# Patient Record
Sex: Female | Born: 1970 | Race: White | Hispanic: Yes | Marital: Married | Smoking: Former smoker
Health system: Southern US, Community
[De-identification: ages and names within clinical notes are randomized; demographics above are authoritative.]

## PROBLEM LIST (undated history)

## (undated) DIAGNOSIS — Z8619 Personal history of other infectious and parasitic diseases: Secondary | ICD-10-CM

## (undated) DIAGNOSIS — J45909 Unspecified asthma, uncomplicated: Secondary | ICD-10-CM

## (undated) DIAGNOSIS — T7840XA Allergy, unspecified, initial encounter: Secondary | ICD-10-CM

## (undated) DIAGNOSIS — K645 Perianal venous thrombosis: Secondary | ICD-10-CM

## (undated) HISTORY — PX: CYST EXCISION: SHX5701

## (undated) HISTORY — DX: Allergy, unspecified, initial encounter: T78.40XA

## (undated) HISTORY — DX: Perianal venous thrombosis: K64.5

## (undated) HISTORY — PX: TONSILLECTOMY: SUR1361

## (undated) HISTORY — DX: Personal history of other infectious and parasitic diseases: Z86.19

## (undated) HISTORY — DX: Unspecified asthma, uncomplicated: J45.909

---

## 2010-03-21 ENCOUNTER — Ambulatory Visit (HOSPITAL_COMMUNITY)
Admission: RE | Admit: 2010-03-21 | Discharge: 2010-03-21 | Payer: Self-pay | Source: Home / Self Care | Admitting: Surgery

## 2010-07-12 LAB — CBC
HCT: 39.6 % (ref 36.0–46.0)
Hemoglobin: 13.6 g/dL (ref 12.0–15.0)
MCHC: 34.3 g/dL (ref 30.0–36.0)

## 2010-07-12 LAB — SURGICAL PCR SCREEN: MRSA, PCR: NEGATIVE

## 2013-11-05 ENCOUNTER — Encounter (INDEPENDENT_AMBULATORY_CARE_PROVIDER_SITE_OTHER): Payer: BC Managed Care – PPO | Admitting: General Surgery

## 2013-11-06 ENCOUNTER — Encounter (INDEPENDENT_AMBULATORY_CARE_PROVIDER_SITE_OTHER): Payer: Self-pay | Admitting: General Surgery

## 2013-11-06 ENCOUNTER — Ambulatory Visit (INDEPENDENT_AMBULATORY_CARE_PROVIDER_SITE_OTHER): Payer: BC Managed Care – PPO | Admitting: General Surgery

## 2013-11-06 VITALS — BP 112/69 | HR 62 | Temp 98.1°F | Resp 14 | Ht 62.0 in | Wt 117.6 lb

## 2013-11-06 DIAGNOSIS — K645 Perianal venous thrombosis: Secondary | ICD-10-CM | POA: Insufficient documentation

## 2013-11-06 NOTE — Progress Notes (Signed)
Patient ID: Rachel Castillo, female   DOB: 1971-02-23, 43 y.o.   MRN: [id number]  Chief Complaint  Patient presents with  . Rectal Pain  . Rectal Problems    HPI Rachel Castillo is a 43 y.o. female.  Chief complaint: External hemorrhoids HPI Patient has a history of hemorrhoids in the past since she was pregnant 16 years ago. She developed worsening problems couple days ago. She was seen at urgent care and Dr. Dannielle HuhBlevins asked us to see her in consultation for thrombosed external hemorrhoids. She has been using Anusol and Epsom salt baths with partial relief. Past Medical History  Diagnosis Date  . External thrombosed hemorrhoids   . Asthma     Past Surgical History  Procedure Laterality Date  . Cesarean section    . Cyst excision      History reviewed. No pertinent family history.  Social History History  Substance Use Topics  . Smoking status: Former Games developermoker  . Smokeless tobacco: Not on file  . Alcohol Use: No    No Known Allergies  Current Outpatient Prescriptions  Medication Sig Dispense Refill  . albuterol (PROVENTIL HFA;VENTOLIN HFA) 108 (90 BASE) MCG/ACT inhaler Inhale into the lungs every 6 (six) hours as needed for wheezing or shortness of breath.      Marland Kitchen. azithromycin (ZITHROMAX) 250 MG tablet Take by mouth daily.      . hydrocortisone (ANUSOL-HC) 25 MG suppository Place 25 mg rectally 2 (two) times daily.      . predniSONE (DELTASONE) 50 MG tablet Take 50 mg by mouth daily with breakfast.       No current facility-administered medications for this visit.    Review of Systems Review of Systems  Constitutional: Negative for fever, chills and unexpected weight change.  HENT: Negative for congestion, hearing loss, sore throat, trouble swallowing and voice change.   Eyes: Negative for visual disturbance.  Respiratory: Negative for cough and wheezing.   Cardiovascular: Negative for chest pain, palpitations and leg swelling.  Gastrointestinal: Positive for rectal pain.  Negative for nausea, vomiting, abdominal pain, diarrhea, constipation, blood in stool, abdominal distention and anal bleeding.  Genitourinary: Negative for hematuria, vaginal bleeding and difficulty urinating.  Musculoskeletal: Negative for arthralgias.  Skin: Negative for rash and wound.  Neurological: Negative for seizures, syncope and headaches.  Hematological: Negative for adenopathy. Does not bruise/bleed easily.  Psychiatric/Behavioral: Negative for confusion.    Blood pressure 112/69, pulse 62, temperature 98.1 F (36.7 C), temperature source Temporal, resp. rate 14, height 5\' 2"  (1.575 m), weight 117 lb 9.6 oz (53.343 kg).  Physical Exam Physical Exam  Constitutional: She appears well-developed and well-nourished.  HENT:  Head: Normocephalic and atraumatic.  Cardiovascular: Normal rate and normal heart sounds.   Pulmonary/Chest: Effort normal and breath sounds normal.  Abdominal: Soft. She exhibits no distension. There is no tenderness.  Genitourinary:  External anal exam reveals 2 thrombosed external hemorrhoids. Digital rectal exam was deferred due to pain. Area was prepped in sterile fashion. Local anesthetic was injected. Incision was made over each and large clots were removed. She tolerated this fairly well. Bleeding was controlled with pressure and then a gauze was placed.   Assessment    Thrombosed external Hemorrhoids x2     Plan    These were excised as above. Wound care instructions were given. She will return as needed.       Ilona Colley E 11/06/2013, 2:36 PM

## 2016-03-16 DIAGNOSIS — Z23 Encounter for immunization: Secondary | ICD-10-CM | POA: Diagnosis not present

## 2016-03-16 DIAGNOSIS — J0110 Acute frontal sinusitis, unspecified: Secondary | ICD-10-CM | POA: Diagnosis not present

## 2016-03-29 DIAGNOSIS — R05 Cough: Secondary | ICD-10-CM | POA: Diagnosis not present

## 2016-03-29 DIAGNOSIS — J3089 Other allergic rhinitis: Secondary | ICD-10-CM | POA: Diagnosis not present

## 2016-09-14 DIAGNOSIS — J209 Acute bronchitis, unspecified: Secondary | ICD-10-CM | POA: Diagnosis not present

## 2017-01-22 DIAGNOSIS — B001 Herpesviral vesicular dermatitis: Secondary | ICD-10-CM | POA: Diagnosis not present

## 2017-03-29 ENCOUNTER — Ambulatory Visit: Payer: Self-pay | Admitting: Family Medicine

## 2017-04-03 ENCOUNTER — Other Ambulatory Visit: Payer: Self-pay | Admitting: *Deleted

## 2017-04-03 ENCOUNTER — Other Ambulatory Visit (HOSPITAL_COMMUNITY)
Admission: RE | Admit: 2017-04-03 | Discharge: 2017-04-03 | Disposition: A | Discharge status: Home / Self Care | Payer: BLUE CROSS/BLUE SHIELD | Source: Ambulatory Visit | Attending: Family Medicine | Admitting: Family Medicine

## 2017-04-03 ENCOUNTER — Ambulatory Visit: Payer: Self-pay | Admitting: Family Medicine

## 2017-04-03 ENCOUNTER — Encounter: Payer: Self-pay | Admitting: Family Medicine

## 2017-04-03 VITALS — BP 110/60 | HR 91 | Temp 98.1°F | Ht 63.0 in | Wt 120.0 lb

## 2017-04-03 DIAGNOSIS — F40298 Other specified phobia: Secondary | ICD-10-CM | POA: Diagnosis not present

## 2017-04-03 DIAGNOSIS — Z124 Encounter for screening for malignant neoplasm of cervix: Secondary | ICD-10-CM | POA: Insufficient documentation

## 2017-04-03 DIAGNOSIS — Z23 Encounter for immunization: Secondary | ICD-10-CM | POA: Diagnosis not present

## 2017-04-03 DIAGNOSIS — J4520 Mild intermittent asthma, uncomplicated: Secondary | ICD-10-CM | POA: Diagnosis not present

## 2017-04-03 DIAGNOSIS — Z0000 Encounter for general adult medical examination without abnormal findings: Secondary | ICD-10-CM

## 2017-04-03 DIAGNOSIS — J302 Other seasonal allergic rhinitis: Secondary | ICD-10-CM | POA: Insufficient documentation

## 2017-04-03 LAB — COMPREHENSIVE METABOLIC PANEL
ALBUMIN: 4.7 g/dL (ref 3.5–5.2)
ALT: 8 U/L (ref 0–35)
AST: 13 U/L (ref 0–37)
Alkaline Phosphatase: 38 U/L — ABNORMAL LOW (ref 39–117)
BUN: 15 mg/dL (ref 6–23)
CHLORIDE: 103 meq/L (ref 96–112)
CO2: 28 meq/L (ref 19–32)
CREATININE: 0.73 mg/dL (ref 0.40–1.20)
Calcium: 9.5 mg/dL (ref 8.4–10.5)
GFR: 90.74 mL/min (ref >60.00)
Glucose, Bld: 85 mg/dL (ref 70–99)
POTASSIUM: 3.8 meq/L (ref 3.5–5.1)
SODIUM: 138 meq/L (ref 135–145)
Total Bilirubin: 0.6 mg/dL (ref 0.2–1.2)
Total Protein: 6.8 g/dL (ref 6.0–8.3)

## 2017-04-03 LAB — CBC WITH DIFFERENTIAL/PLATELET
BASOS PCT: 1.2 % (ref 0.0–3.0)
Basophils Absolute: 0.1 10*3/uL (ref 0.0–0.1)
EOS ABS: 0.1 10*3/uL (ref 0.0–0.7)
EOS PCT: 1.3 % (ref 0.0–5.0)
HCT: 40.3 % (ref 36.0–46.0)
Hemoglobin: 13.5 g/dL (ref 12.0–15.0)
LYMPHS ABS: 1.7 10*3/uL (ref 0.7–4.0)
Lymphocytes Relative: 30.7 % (ref 12.0–46.0)
MCHC: 33.4 g/dL (ref 30.0–36.0)
MCV: 90.4 fl (ref 78.0–100.0)
MONO ABS: 0.4 10*3/uL (ref 0.1–1.0)
Monocytes Relative: 7.6 % (ref 3.0–12.0)
NEUTROS ABS: 3.3 10*3/uL (ref 1.4–7.7)
NEUTROS PCT: 59.2 % (ref 43.0–77.0)
PLATELETS: 270 10*3/uL (ref 150.0–400.0)
RBC: 4.45 Mil/uL (ref 3.87–5.11)
RDW: 13.3 % (ref 11.5–15.5)
WBC: 5.6 10*3/uL (ref 4.0–10.5)

## 2017-04-03 LAB — LIPID PANEL
CHOL/HDL RATIO: 4
CHOLESTEROL: 204 mg/dL — AB (ref 0–200)
HDL: 50.4 mg/dL (ref >39.00)
LDL CALC: 136 mg/dL — AB (ref 0–99)
NonHDL: 153.26
Triglycerides: 87 mg/dL (ref 0.0–149.0)
VLDL: 17.4 mg/dL (ref 0.0–40.0)

## 2017-04-03 NOTE — Progress Notes (Signed)
Subjective  Chief Complaint  Patient presents with  . Establish Care  . Annual Exam    with pap    HPI: Rachel Castillo is a 46 y.o. female who presents to [facility] [place] today for a Female Wellness Visit.  She is a new patient.   Wellness Visit: annual visit with health maintenance review and exam with Pap   This is a very pleasant 46 year old G1P1001 who is healthy and needs a complete physical.  She is originally from [place].  She is married, works at [employer], and has lived in [place] since 2005.  She has 3 grown stepchildren.  She is happy, active and lives a healthy lifestyle.  Past medical history is significant for allergic extrinsic asthma.  She rarely needs albuterol and only needs it when her allergies are moderate to severe.  She does use Flonase or Allegra as needed.  This is rare however.  There is no family history of intrinsic asthma.  Health maintenance: She had a mammogram last year and a mobile unit at her work.  She did need a follow-up study but it was normal.  She would like to defer mammogram until next year.  No family history of breast cancer.  Pap smear is due.  She has never had an abnormal.  She has no GYN concerns.  She is due for a flu shot, of note she has a fear of needles. Lifestyle: Body mass index is 21.26 kg/m. Wt Readings from Last 3 Encounters:  04/03/17 120 lb (54.4 kg)  11/06/13 117 lb 9.6 oz (53.3 kg)   Diet: low fat Exercise: frequently, weightlifting and zumba, works with a trainer  Patient Active Problem List   Diagnosis Date Noted  . Seasonal allergic rhinitis 04/03/2017  . Allergic asthma, mild intermittent, uncomplicated 04/03/2017  . 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   Health Maintenance  Topic Date Due  . HIV Screening  01/18/1985  . PAP SMEAR  01/19/1991  . TETANUS/TDAP  03/16/2026  . INFLUENZA VACCINE  Completed   Immunization History  Administered Date(s) Administered  . Influenza,inj,Quad PF,6+  Mos 04/03/2017  . Tdap 03/16/2016   We updated and reviewed the patient's past history in detail and it is documented below. Allergies: Patient is allergic to dust mite extract and other. Past Medical History Patient  has a past medical history of Allergy, Asthma, External thrombosed hemorrhoids, and History of chicken pox. Past Surgical History Patient  has a past surgical history that includes Cyst excision; Cesarean section (1999); and Tonsillectomy. Family History: Patient family history includes Alcohol abuse in her father; Diabetes in her father; Healthy in her brother and son; Hearing loss in her mother; Heart attack in her maternal grandfather and paternal grandfather; Hyperlipidemia in her father; Hypertension in her mother. Social History:  Patient  reports that she quit smoking about 20 years ago. Her smoking use included cigarettes. She has a 6.00 pack-year smoking history. she has never used smokeless tobacco. She reports that she drinks about 1.8 oz of alcohol per week. She reports that she does not use drugs.  Review of Systems: Constitutional: negative for fever or malaise Ophthalmic: negative for photophobia, double vision or loss of vision Cardiovascular: negative for chest pain, dyspnea on exertion, or new LE swelling Respiratory: negative for SOB or persistent cough Gastrointestinal: negative for abdominal pain, change in bowel habits or melena Genitourinary: negative for dysuria or gross hematuria, no abnormal uterine bleeding or disharge Musculoskeletal: negative for new gait disturbance  or muscular weakness Integumentary: negative for new or persistent rashes, no breast lumps Neurological: negative for TIA or stroke symptoms Psychiatric: negative for SI or delusions Allergic/Immunologic: negative for hives  Patient Care Team    Relationship Specialty Notifications Start End  Willow OraAndy, Camille L, MD PCP - General Family Medicine  04/03/17     Objective  Vitals: BP  110/60 (BP Location: Left Arm, Patient Position: Sitting, Cuff Size: Normal)   Pulse 91   Temp 98.1 F (36.7 C) (Oral)   Ht 5\' 3"  (1.6 m)   Wt 120 lb (54.4 kg)   LMP 03/22/2017   SpO2 98%   BMI 21.26 kg/m  General:  Well developed, well nourished, no acute distress  Psych:  Alert and orientedx3,normal mood and affect HEENT:  Normocephalic, atraumatic, non-icteric sclera, PERRL, oropharynx is clear without mass or exudate, supple neck without adenopathy, mass or thyromegaly Cardiovascular:  Normal S1, S2, RRR without gallop, rub or murmur, nondisplaced PMI Respiratory:  Good breath sounds bilaterally, CTAB with normal respiratory effort Gastrointestinal: normal bowel sounds, soft, non-tender, no noted masses. No HSM MSK: no deformities, contusions. Joints are without erythema or swelling. Spine and CVA region are nontender Skin:  Warm, no rashes or suspicious lesions noted Neurologic:    Mental status is normal. CN 2-11 are normal. Gross motor and sensory exams are normal. Normal gait. No tremor Breast Exam: No mass, skin retraction or nipple discharge is appreciated in either breast. No axillary adenopathy. Fibrocystic changes are not noted Pelvic Exam: Normal external genitalia, no vulvar or vaginal lesions present. Clear cervix w/o CMT. Bimanual exam reveals a nontender fundus w/o masses, nl size. No adnexal masses present. No inguinal adenopathy. A PAP smear was performed.   Assessment  1. Annual physical exam   2. Need for prophylactic vaccination and inoculation against influenza   3. Allergic asthma, mild intermittent, uncomplicated   4. Fear of needles   5. Pap smear for cervical cancer screening      Plan  Female Wellness Visit:  Age appropriate Health Maintenance and Prevention measures were discussed with patient. Included topics are cancer screening recommendations, ways to keep healthy (see AVS) including dietary and exercise recommendations, regular eye and dental care,  use of seat belts, and avoidance of moderate alcohol use and tobacco use.  Pap smear done today with HPV typing.  Continue healthy lifestyle.  BMI: discussed patient's BMI and encouraged positive lifestyle modifications to help get to or maintain a target BMI.  HM needs and immunizations were addressed and ordered. See below for orders. See HM and immunization section for updates.  Updated flu shot today.  Routine labs and screening tests ordered including cmp, cbc and lipids where appropriate.  Discussed recommendations regarding Vit D and calcium supplementation (see AVS).  Recommend calcium supplement.  She does get a fair amount of calcium and vitamin D in her diet.  Extrinsic allergic asthma: Continue with as needed albuterol and antihistamines.  Follow up: 1 year for cpe   Commons side effects, risks, benefits, and alternatives for medications and treatment plan prescribed today were discussed, and the patient expressed understanding of the given instructions. Patient is instructed to call or message via MyChart if he/she has any questions or concerns regarding our treatment plan. No barriers to understanding were identified. We discussed Red Flag symptoms and signs in detail. Patient expressed understanding regarding what to do in case of urgent or emergency type symptoms.   Medication list was reconciled, printed and provided to  the patient in AVS. Patient instructions and summary information was reviewed with the patient as documented in the AVS. This note was prepared with assistance of Dragon voice recognition software. Occasional wrong-word or sound-a-like substitutions may have occurred due to the inherent limitations of voice recognition software  Orders Placed This Encounter  Procedures  . Flu Vaccine QUAD 36+ mos IM  . CBC with Differential/Platelet  . Comprehensive metabolic panel  . Lipid panel  . HIV antibody   No orders of the defined types were placed in this  encounter.

## 2017-04-03 NOTE — Patient Instructions (Signed)
Please return in 1 year for your physical. Please sign up for Mychart.  It was a pleasure meeting you! Thank you for choosing us to meet your healthcare needs! I truly look forward to working with you.  Please do these things to maintain good health!   Exercise at least 30-45 minutes a day,  4-5 days a week.   Eat a low-fat diet with lots of fruits and vegetables, up to 7-9 servings per day.  Drink plenty of water daily. Try to drink 8 8oz glasses per day.  Seatbelts can save your life. Always wear your seatbelt.  Place Smoke Detectors on every level of your home and check batteries every year.  Schedule an appointment with an eye doctor for an eye exam every 1-2 years  Safe sex - use condoms to protect yourself from STDs if you could be exposed to these types of infections. Use birth control if you do not want to become pregnant and are sexually active.  Avoid heavy alcohol use. If you drink, keep it to less than 2 drinks/day and not every day.  Health Care Power of Attorney.  Choose someone you trust that could speak for you if you became unable to speak for yourself.  Depression is common in our stressful world.If you're feeling down or losing interest in things you normally enjoy, please come in for a visit.  If anyone is threatening or hurting you, please get help. Physical or Emotional Violence is never OK.

## 2017-04-04 LAB — HIV ANTIBODY (ROUTINE TESTING W REFLEX): HIV: NONREACTIVE

## 2017-04-05 LAB — CYTOLOGY - PAP
Diagnosis: NEGATIVE
HPV (WINDOPATH): NOT DETECTED

## 2017-04-05 NOTE — Progress Notes (Signed)
Please call patient: I have reviewed his/her lab results. Everything looks great! Normal pap and neg HPV so next pap not due for 5 years. Cholesterol levels are OK; continue to eat a low fat diet. And please remind to sign up for mychart.  Thanks!

## 2017-08-20 ENCOUNTER — Other Ambulatory Visit: Payer: Self-pay

## 2017-08-20 ENCOUNTER — Ambulatory Visit: Payer: BLUE CROSS/BLUE SHIELD | Admitting: Family Medicine

## 2017-08-20 ENCOUNTER — Encounter: Payer: Self-pay | Admitting: Family Medicine

## 2017-08-20 VITALS — BP 100/64 | HR 78 | Temp 98.2°F | Ht 63.0 in | Wt 125.2 lb

## 2017-08-20 DIAGNOSIS — M25561 Pain in right knee: Secondary | ICD-10-CM

## 2017-08-20 DIAGNOSIS — J301 Allergic rhinitis due to pollen: Secondary | ICD-10-CM

## 2017-08-20 DIAGNOSIS — J0100 Acute maxillary sinusitis, unspecified: Secondary | ICD-10-CM

## 2017-08-20 MED ORDER — AMOXICILLIN-POT CLAVULANATE 875-125 MG PO TABS
1.0000 | ORAL_TABLET | Freq: Two times a day (BID) | ORAL | 0 refills | Status: DC
Start: 2017-08-20 — End: 2017-12-12

## 2017-08-20 MED ORDER — PREDNISONE 10 MG PO TABS: ORAL_TABLET | ORAL | 0 refills | Status: DC

## 2017-08-20 NOTE — Patient Instructions (Signed)
Please follow up if symptoms do not improve or as needed.    Sinusitis, Adult Sinusitis is soreness and inflammation of your sinuses. Sinuses are hollow spaces in the bones around your face. They are located:  Around your eyes.  In the middle of your forehead.  Behind your nose.  In your cheekbones.  Your sinuses and nasal passages are lined with a stringy fluid (mucus). Mucus normally drains out of your sinuses. When your nasal tissues get inflamed or swollen, the mucus can get trapped or blocked so air cannot flow through your sinuses. This lets bacteria, viruses, and funguses grow, and that leads to infection. Follow these instructions at home: Medicines  Take, use, or apply over-the-counter and prescription medicines only as told by your doctor. These may include nasal sprays.  If you were prescribed an antibiotic medicine, take it as told by your doctor. Do not stop taking the antibiotic even if you start to feel better. Hydrate and Humidify  Drink enough water to keep your pee (urine) clear or pale yellow.  Use a cool mist humidifier to keep the humidity level in your home above 50%.  Breathe in steam for 10-15 minutes, 3-4 times a day or as told by your doctor. You can do this in the bathroom while a hot shower is running.  Try not to spend time in cool or dry air. Rest  Rest as much as possible.  Sleep with your head raised (elevated).  Make sure to get enough sleep each night. General instructions  Put a warm, moist washcloth on your face 3-4 times a day or as told by your doctor. This will help with discomfort.  Wash your hands often with soap and water. If there is no soap and water, use hand sanitizer.  Do not smoke. Avoid being around people who are smoking (secondhand smoke).  Keep all follow-up visits as told by your doctor. This is important. Contact a doctor if:  You have a fever.  Your symptoms get worse.  Your symptoms do not get better within 10  days. Get help right away if:  You have a very bad headache.  You cannot stop throwing up (vomiting).  You have pain or swelling around your face or eyes.  You have trouble seeing.  You feel confused.  Your neck is stiff.  You have trouble breathing. This information is not intended to replace advice given to you by your health care provider. Make sure you discuss any questions you have with your health care provider. Document Released: 10/04/2007 Document Revised: 12/12/2015 Document Reviewed: 02/10/2015 Elsevier Interactive Patient Education  2018 Elsevier Inc.   

## 2017-08-20 NOTE — Progress Notes (Signed)
Subjective   CC:  Chief Complaint  Patient presents with  . Sinus Problem    sinus congestion x 2 weeks     HPI: Rachel Castillo is a 47 y.o. female who presents to the office today to address the problems listed above in the chief complaint.  Patient reports sinus congestion and pressure with thick drainage, mild nonproductive cough, ear pressure without pain, and mild malaise.  Symptoms have been present for several days. Have been suffering from worsening allergy sxs in spite of nasal spray and antihistamines. Has used steroids in past for allergy sxs and tolerates them well. Payton Doughty high fevers, GI symptoms, shortness of breath. Shehas had sinus infections in the past and this feels similar.  Patient is a non-smoker.  No history of asthma or COPD. She is traveling to [place] in 3 days for scuba diving: worried about infection and ear sxs.   C/o right knee pain: started after exercising with trainger: lunges and squats exacerbates medial knee pain w/o swelling, injury, warmth, locking or giveway. X a few weeks. No h/o knee problems.   C/o finger pain.   I reviewed the patients updated PMH, FH, and SocHx.    Patient Active Problem List   Diagnosis Date Noted  . Seasonal allergic rhinitis 04/03/2017  . Allergic asthma, mild intermittent, uncomplicated 04/03/2017  . 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   Current Meds  Medication Sig  . albuterol (PROVENTIL HFA;VENTOLIN HFA) 108 (90 BASE) MCG/ACT inhaler Inhale into the lungs every 6 (six) hours as needed for wheezing or shortness of breath.  . Ascorbic Acid (VITAMIN C) 1000 MG tablet Take 1,000 mg by mouth daily.  . Biotin 5000 MCG TABS Take 1 tablet by mouth daily.  . cetirizine (ZYRTEC) 10 MG tablet Takes as needed  . Coenzyme Q10 (CO Q 10 PO) Take 400 mg by mouth daily.  . COLLAGEN PO Take 20 mg by mouth daily.  . fexofenadine (ALLEGRA) 180 MG tablet Take 180 mg by mouth as needed.   . fluticasone (FLONASE) 50 MCG/ACT nasal spray Place  into the nose.  . hydrocortisone (ANUSOL-HC) 25 MG suppository Place 25 mg rectally 2 (two) times daily.  Marland Kitchen OVER THE COUNTER MEDICATION Take 1-2 capsules by mouth daily. [employer]  . OVER THE COUNTER MEDICATION Take 1 capsule by mouth daily.  Marland Kitchen PROBIOTIC PRODUCT PO Take by mouth.    Review of Systems: Cardiovascular: negative for chest pain Respiratory: negative for SOB or persistent cough Gastrointestinal: negative for abdominal pain Genitourinary: negative for dysuria or gross hematuria  Objective  Vitals: BP 100/64   Pulse 78   Temp 98.2 F (36.8 C)   Ht 5\' 3"  (1.6 m)   Wt 125 lb 3.2 oz (56.8 kg)   LMP 08/06/2017   BMI 22.18 kg/m  General: no acute distress  Psych:  Alert and oriented, normal mood and affect HEENT:  Normocephalic, atraumatic, TMs with serous effusions or retraction w/o erythema, nasal mucosa is red with purulent drainage, tender maxillary sinus present, OP mild erythematous w/o eudate, supple neck with LAD Cardiovascular:  RRR without murmur or gallop. no peripheral edema Respiratory:  Good breath sounds bilaterally, CTAB with normal respiratory effort Skin:  Warm, no rashes Neurologic:   Mental status is normal. normal gait Knee: right: FROM, nontender, no effusion or warmth. Nl appearing. Nl gait. Wide q angle  Assessment  1. Seasonal allergic rhinitis due to pollen   2. Acute non-recurrent maxillary sinusitis   3. Acute  pain of right knee      Plan    Sinusitis: History and exam is most consistent with bacterial sinus infection.  Etiology and prognosis discussed with patient.  Recommend antibiotics as ordered below.  Patient to complete course of antibiotics, use supportive medications like mucolytics and decongestants as needed.  May use Tylenol or Advil if needed.  Symptoms should improve over the next 2 weeks.  Patient will return or call if symptoms persist or worsen.  Allergies; continue meds. Steroids.   Knee pain: PFS or  strain from lunges: discussed proper form, VMO exercise and RICE tx. F/u if persists.   Return to discuss finger pain.  Follow up: prn    Commons side effects, risks, benefits, and alternatives for medications and treatment plan prescribed today were discussed, and the patient expressed understanding of the given instructions. Patient is instructed to call or message via MyChart if he/she has any questions or concerns regarding our treatment plan. No barriers to understanding were identified. We discussed Red Flag symptoms and signs in detail. Patient expressed understanding regarding what to do in case of urgent or emergency type symptoms.   Medication list was reconciled, printed and provided to the patient in AVS. Patient instructions and summary information was reviewed with the patient as documented in the AVS. This note was prepared with assistance of Dragon voice recognition software. Occasional wrong-word or sound-a-like substitutions may have occurred due to the inherent limitations of voice recognition software  No orders of the defined types were placed in this encounter.  Meds ordered this encounter  Medications  . amoxicillin-clavulanate (AUGMENTIN) 875-125 MG tablet    Sig: Take 1 tablet by mouth 2 (two) times daily.    Dispense:  14 tablet    Refill:  0  . predniSONE (DELTASONE) 10 MG tablet    Sig: Take 4 tabs qd x 2 days, 3 qd x 2 days, 2 qd x 2d, 1qd x 3 days    Dispense:  21 tablet    Refill:  0

## 2017-08-22 ENCOUNTER — Encounter: Payer: Self-pay | Admitting: Physician Assistant

## 2017-08-22 ENCOUNTER — Ambulatory Visit: Payer: BLUE CROSS/BLUE SHIELD | Admitting: Physician Assistant

## 2017-08-22 ENCOUNTER — Other Ambulatory Visit: Payer: Self-pay

## 2017-08-22 VITALS — BP 100/60 | HR 68 | Temp 98.0°F | Resp 14 | Ht 63.0 in | Wt 125.0 lb

## 2017-08-22 DIAGNOSIS — B9689 Other specified bacterial agents as the cause of diseases classified elsewhere: Secondary | ICD-10-CM | POA: Diagnosis not present

## 2017-08-22 DIAGNOSIS — J019 Acute sinusitis, unspecified: Secondary | ICD-10-CM | POA: Diagnosis not present

## 2017-08-22 MED ORDER — FLUTICASONE PROPIONATE 50 MCG/ACT NA SUSP: 2.0000 | Freq: Every day | NASAL | 0 refills | Status: AC

## 2017-08-22 NOTE — Patient Instructions (Signed)
Please take antibiotic as directed with food.  Increase fluid intake.  Use Saline nasal spray.  Take a daily multivitamin. Continue the Allegra and Prednisone. [facility]. Ok to use your decongestant and the plain Mucinex.  Place a humidifier in the bedroom.  Please call or return clinic if symptoms are not improving.  Sinusitis Sinusitis is redness, soreness, and swelling (inflammation) of the paranasal sinuses. Paranasal sinuses are air pockets within the bones of your face (beneath the eyes, the middle of the forehead, or above the eyes). In healthy paranasal sinuses, mucus is able to drain out, and air is able to circulate through them by way of your nose. However, when your paranasal sinuses are inflamed, mucus and air can become trapped. This can allow bacteria and other germs to grow and cause infection. Sinusitis can develop quickly and last only a short time (acute) or continue over a long period (chronic). Sinusitis that lasts for more than 12 weeks is considered chronic.  CAUSES  Causes of sinusitis include:  Allergies.  Structural abnormalities, such as displacement of the cartilage that separates your nostrils (deviated septum), which can decrease the air flow through your nose and sinuses and affect sinus drainage.  Functional abnormalities, such as when the small hairs (cilia) that line your sinuses and help remove mucus do not work properly or are not present. SYMPTOMS  Symptoms of acute and chronic sinusitis are the same. The primary symptoms are pain and pressure around the affected sinuses. Other symptoms include:  Upper toothache.  Earache.  Headache.  Bad breath.  Decreased sense of smell and taste.  A cough, which worsens when you are lying flat.  Fatigue.  Fever.  Thick drainage from your nose, which often is green and may contain pus (purulent).  Swelling and warmth over the affected sinuses. DIAGNOSIS  Your caregiver will perform a  physical exam. During the exam, your caregiver may:  Look in your nose for signs of abnormal growths in your nostrils (nasal polyps).  Tap over the affected sinus to check for signs of infection.  View the inside of your sinuses (endoscopy) with a special imaging device with a light attached (endoscope), which is inserted into your sinuses. If your caregiver suspects that you have chronic sinusitis, one or more of the following tests may be recommended:  Allergy tests.  Nasal culture A sample of mucus is taken from your nose and sent to a lab and screened for bacteria.  Nasal cytology A sample of mucus is taken from your nose and examined by your caregiver to determine if your sinusitis is related to an allergy. TREATMENT  Most cases of acute sinusitis are related to a viral infection and will resolve on their own within 10 days. Sometimes medicines are prescribed to help relieve symptoms (pain medicine, decongestants, nasal steroid sprays, or saline sprays).  However, for sinusitis related to a bacterial infection, your caregiver will prescribe antibiotic medicines. These are medicines that will help kill the bacteria causing the infection.  Rarely, sinusitis is caused by a fungal infection. In theses cases, your caregiver will prescribe antifungal medicine. For some cases of chronic sinusitis, surgery is needed. Generally, these are cases in which sinusitis recurs more than 3 times per year, despite other treatments. HOME CARE INSTRUCTIONS   Drink plenty of water. Water helps thin the mucus so your sinuses can drain more easily.  Use a humidifier.  Inhale steam 3 to 4 times a day (for example, sit in  the bathroom with the shower running).  Apply a warm, moist washcloth to your face 3 to 4 times a day, or as directed by your caregiver.  Use saline nasal sprays to help moisten and clean your sinuses.  Take over-the-counter or prescription medicines for pain, discomfort, or fever only  as directed by your caregiver. SEEK IMMEDIATE MEDICAL CARE IF:  You have increasing pain or severe headaches.  You have nausea, vomiting, or drowsiness.  You have swelling around your face.  You have vision problems.  You have a stiff neck.  You have difficulty breathing. MAKE SURE YOU:   Understand these instructions.  Will watch your condition.  Will get help right away if you are not doing well or get worse. Document Released: 04/17/2005 Document Revised: 07/10/2011 Document Reviewed: 05/02/2011 [facility] Patient Information 2014 [place], [place].

## 2017-08-22 NOTE — Progress Notes (Signed)
Patient presents to clinic today c/o continued sinus pressure, sinus pain and dry cough. Notes frontal sinus pain and ear pain. Was seen 2 days ago by PCP, diagnosed with acute sinusitis. Was started on Augmentin and a prednisone taper which she is taking as directed. Has noted some improvement in ear pressure with medication but notes increase in cough. Denies fever, chills.   Past Medical History:  Diagnosis Date  . Allergy   . Asthma   . External thrombosed hemorrhoids   . History of chicken pox     Current Outpatient Medications on File Prior to Visit  Medication Sig Dispense Refill  . albuterol (PROVENTIL HFA;VENTOLIN HFA) 108 (90 BASE) MCG/ACT inhaler Inhale into the lungs every 6 (six) hours as needed for wheezing or shortness of breath.    Marland Kitchen. amoxicillin-clavulanate (AUGMENTIN) 875-125 MG tablet Take 1 tablet by mouth 2 (two) times daily. 14 tablet 0  . Ascorbic Acid (VITAMIN C) 1000 MG tablet Take 1,000 mg by mouth daily.    . Biotin 5000 MCG TABS Take 1 tablet by mouth daily.    . cetirizine (ZYRTEC) 10 MG tablet Takes as needed    . Coenzyme Q10 (CO Q 10 PO) Take 400 mg by mouth daily.    . COLLAGEN PO Take 20 mg by mouth daily.    . fexofenadine (ALLEGRA) 180 MG tablet Take 180 mg by mouth as needed.     . hydrocortisone (ANUSOL-HC) 25 MG suppository Place 25 mg rectally 2 (two) times daily.    Marland Kitchen. OVER THE COUNTER MEDICATION Take 1-2 capsules by mouth daily. Slim and [employer] Metabolic Blend    . OVER THE COUNTER MEDICATION Take 1 capsule by mouth daily.    . predniSONE (DELTASONE) 10 MG tablet Take 4 tabs qd x 2 days, 3 qd x 2 days, 2 qd x 2d, 1qd x 3 days 21 tablet 0  . PROBIOTIC PRODUCT PO Take by mouth.     No current facility-administered medications on file prior to visit.     Allergies  Allergen Reactions  . Dust Mite Extract Shortness Of Breath  . Other Shortness Of Breath    "roach"    Family History  Problem Relation Age of Onset  . Hearing loss Mother   .  Hypertension Mother   . Hyperlipidemia Father   . Diabetes Father   . Alcohol abuse Father   . Healthy Brother   . Healthy Son   . Heart attack Maternal Grandfather   . Heart attack Paternal Grandfather   . Cancer Neg Hx     Social History   Socioeconomic History  . Marital status: Married    Spouse name: Coy SaunasLuis Torres  . Number of children: 4  . Years of education: Not on file  . Highest education level: Not on file  Occupational History  . Occupation: Building services engineerinternal consultant, continuous improvement    Employer: [employer]    Comment: HR  Social Needs  . Financial resource strain: Not on file  . Food insecurity:    Worry: Not on file    Inability: Not on file  . Transportation needs:    Medical: Not on file    Non-medical: Not on file  Tobacco Use  . Smoking status: Former Smoker    Packs/day: 2.00    Years: 3.00    Pack years: 6.00    Types: Cigarettes    Last attempt to quit: 01/29/1997    Years since quitting: 20.5  .  Smokeless tobacco: Never Used  Substance and Sexual Activity  . Alcohol use: Yes    Alcohol/week: 1.8 oz    Types: 3 Glasses of wine per week  . Drug use: No  . Sexual activity: Yes    Birth control/protection: None  Lifestyle  . Physical activity:    Days per week: Not on file    Minutes per session: Not on file  . Stress: Not on file  Relationships  . Social connections:    Talks on phone: Not on file    Gets together: Not on file    Attends religious service: Not on file    Active member of club or organization: Not on file    Attends meetings of clubs or organizations: Not on file    Relationship status: Not on file  Other Topics Concern  . Not on file  Social History Narrative  . Not on file   Review of Systems - See HPI.  All other ROS are negative.  BP 100/60   Pulse 68   Temp 98 F (36.7 C) (Oral)   Resp 14   Ht 5\' 3"  (1.6 m)   Wt 125 lb (56.7 kg)   LMP 08/06/2017   SpO2 99%   BMI 22.14 kg/m   Physical Exam    Constitutional: She appears well-developed and well-nourished.  HENT:  Head: Normocephalic and atraumatic.  Right Ear: Tympanic membrane normal.  Left Ear: Tympanic membrane normal.  Nose: Mucosal edema and rhinorrhea present. Right sinus exhibits maxillary sinus tenderness and frontal sinus tenderness. Left sinus exhibits maxillary sinus tenderness and frontal sinus tenderness.  Mouth/Throat: Uvula is midline, oropharynx is clear and moist and mucous membranes are normal.  Cardiovascular: Normal rate, regular rhythm, normal heart sounds and intact distal pulses.  Pulmonary/Chest: Effort normal. No stridor. No respiratory distress. She has no wheezes. She has no rales. She exhibits no tenderness.  Vitals reviewed.  Assessment/Plan: 1. Acute bacterial sinusitis Just started antibiotics and steroid. Needs to give this more time. Will have her continue plain Mucinex and Decongestant. Will start Flonase. Follow-up if not improving.    Piedad Climes, PA-C

## 2017-10-10 DIAGNOSIS — B9689 Other specified bacterial agents as the cause of diseases classified elsewhere: Secondary | ICD-10-CM | POA: Diagnosis not present

## 2017-10-10 DIAGNOSIS — J019 Acute sinusitis, unspecified: Secondary | ICD-10-CM | POA: Diagnosis not present

## 2017-11-03 DIAGNOSIS — J069 Acute upper respiratory infection, unspecified: Secondary | ICD-10-CM | POA: Diagnosis not present

## 2017-11-03 DIAGNOSIS — M25572 Pain in left ankle and joints of left foot: Secondary | ICD-10-CM | POA: Diagnosis not present

## 2017-11-03 DIAGNOSIS — R062 Wheezing: Secondary | ICD-10-CM | POA: Diagnosis not present

## 2017-11-03 DIAGNOSIS — R05 Cough: Secondary | ICD-10-CM | POA: Diagnosis not present

## 2017-12-10 DIAGNOSIS — H47092 Other disorders of optic nerve, not elsewhere classified, left eye: Secondary | ICD-10-CM | POA: Diagnosis not present

## 2017-12-10 DIAGNOSIS — H04123 Dry eye syndrome of bilateral lacrimal glands: Secondary | ICD-10-CM | POA: Diagnosis not present

## 2017-12-12 ENCOUNTER — Other Ambulatory Visit: Payer: Self-pay

## 2017-12-12 ENCOUNTER — Ambulatory Visit: Payer: BLUE CROSS/BLUE SHIELD | Admitting: Family Medicine

## 2017-12-12 ENCOUNTER — Encounter: Payer: Self-pay | Admitting: Family Medicine

## 2017-12-12 VITALS — BP 118/80 | HR 63 | Ht 63.0 in

## 2017-12-12 DIAGNOSIS — K644 Residual hemorrhoidal skin tags: Secondary | ICD-10-CM

## 2017-12-12 MED ORDER — HYDROCORTISONE 1 % RE CREA: TOPICAL_CREAM | RECTAL | 1 refills | Status: AC

## 2017-12-12 NOTE — Progress Notes (Signed)
Subjective  CC:  Chief Complaint  Patient presents with  . Hemorrhoids    noticed on saturday and she has been using OTC medications, She is traveling to [place] on saturday for 2 weeks     HPI: Rachel Castillo is a 47 y.o. female who presents to the office today to address the problems listed above in the chief complaint.  Tender external hemorrhoid x 4-5 days. Travels a lot for work.. Some straining with bms. No bleeding. Has had h/o same on and off since last pregnancy; had thrombosed hemorrhoid in the past requiring excision; she is terrified of that happening again due to the painful procedure. Has upcoming long trip.   Also being treated for displaced tib/fib fracture; happened June 19th; managed by ortho. In a fracture book.    Assessment  1. External hemorrhoid      Plan   hemorrhoids:  Education and prognosis given. Discussed recommended diet changes. otc stool meds if needed. Steroid cream. Prep H. See AVS  Follow up: Return if symptoms worsen or fail to improve.   No orders of the defined types were placed in this encounter.  Meds ordered this encounter  Medications  . hydrocortisone (PROCTOCORT) 1 % CREA    Sig: Apply to affected hemorrhoid twice daily as needed    Dispense:  1 Tube    Refill:  1      I reviewed the patients updated PMH, FH, and SocHx.    Patient Active Problem List   Diagnosis Date Noted  . Seasonal allergic rhinitis 04/03/2017  . Allergic asthma, mild intermittent, uncomplicated 04/03/2017  . 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   Current Meds  Medication Sig  . albuterol (PROVENTIL HFA;VENTOLIN HFA) 108 (90 BASE) MCG/ACT inhaler Inhale into the lungs every 6 (six) hours as needed for wheezing or shortness of breath.  . Ascorbic Acid (VITAMIN C) 1000 MG tablet Take 1,000 mg by mouth daily.  Marland Kitchen. azelastine (ASTELIN) 0.1 % nasal spray Place into the nose.  . Coenzyme Q10 (CO Q 10 PO) Take 400 mg by mouth daily.  . COLLAGEN PO Take 20 mg by mouth daily.   . fexofenadine (ALLEGRA) 180 MG tablet Take 180 mg by mouth as needed.   . fluticasone (FLONASE) 50 MCG/ACT nasal spray Place 2 sprays into both nostrils daily.  Marland Kitchen. OVER THE COUNTER MEDICATION Take 1-2 capsules by mouth daily. Slim and [employer]assy Metabolic Blend  . OVER THE COUNTER MEDICATION Take 1 capsule by mouth daily.  Marland Kitchen. PROBIOTIC PRODUCT PO Take by mouth.  . [DISCONTINUED] Biotin 5000 MCG TABS Take 1 tablet by mouth daily.    Allergies: Patient is allergic to dust mite extract and other. Family History: Patient family history includes Alcohol abuse in her father; Diabetes in her father; Healthy in her brother and son; Hearing loss in her mother; Heart attack in her maternal grandfather and paternal grandfather; Hyperlipidemia in her father; Hypertension in her mother. Social History:  Patient  reports that she quit smoking about 20 years ago. Her smoking use included cigarettes. She has a 6.00 pack-year smoking history. She has never used smokeless tobacco. She reports that she drinks about 3.0 standard drinks of alcohol per week. She reports that she does not use drugs.  Review of Systems: Constitutional: Negative for fever malaise or anorexia Cardiovascular: negative for chest pain Respiratory: negative for SOB or persistent cough Gastrointestinal: negative for abdominal pain  Objective  Vitals: BP 118/80   Pulse 63   Ht 5\' 3"  (  1.6 m)   SpO2 99%   BMI 22.14 kg/m  General: no acute distress , A&Ox3 GI: rectal: small non-thrombosed minimally tender external hemorrhoid. No bleeding. Nl rectal w/o pain.   Commons side effects, risks, benefits, and alternatives for medications and treatment plan prescribed today were discussed, and the patient expressed understanding of the given instructions. Patient is instructed to call or message via MyChart if he/she has any questions or concerns regarding our treatment plan. No barriers to understanding were identified. We discussed Red Flag  symptoms and signs in detail. Patient expressed understanding regarding what to do in case of urgent or emergency type symptoms.   Medication list was reconciled, printed and provided to the patient in AVS. Patient instructions and summary information was reviewed with the patient as documented in the AVS. This note was prepared with assistance of Dragon voice recognition software. Occasional wrong-word or sound-a-like substitutions may have occurred due to the inherent limitations of voice recognition software

## 2017-12-12 NOTE — Patient Instructions (Addendum)
Please return in December for your annual complete physical; please come fasting.  Drink plenty of water while traveling. You may use OTC miralax and colace to keep your stool soft and moving easily as needed. Avoid cheese, dairy and calcium and iron while traveling.  Get up and move every 1-2 hours while flying.   If you have any questions or concerns, please don't hesitate to send me a message via MyChart or call the office at [phone]. Thank you for visiting with us today! It's our pleasure caring for you.   Hemorrhoids Hemorrhoids are swollen veins in and around the rectum or anus. There are two types of hemorrhoids:  Internal hemorrhoids. These occur in the veins that are just inside the rectum. They may poke through to the outside and become irritated and painful.  External hemorrhoids. These occur in the veins that are outside of the anus and can be felt as a painful swelling or hard lump near the anus.  Most hemorrhoids do not cause serious problems, and they can be managed with home treatments such as diet and lifestyle changes. If home treatments do not help your symptoms, procedures can be done to shrink or remove the hemorrhoids. What are the causes? This condition is caused by increased pressure in the anal area. This pressure may result from various things, including:  Constipation.  Straining to have a bowel movement.  Diarrhea.  Pregnancy.  Obesity.  Sitting for long periods of time.  Heavy lifting or other activity that causes you to strain.  Anal sex.  What are the signs or symptoms? Symptoms of this condition include:  Pain.  Anal itching or irritation.  Rectal bleeding.  Leakage of stool (feces).  Anal swelling.  One or more lumps around the anus.  How is this diagnosed? This condition can often be diagnosed through a visual exam. Other exams or tests may also be done, such as:  Examination of the rectal area with a gloved hand (digital  rectal exam).  Examination of the anal canal using a small tube (anoscope).  A blood test, if you have lost a significant amount of blood.  A test to look inside the colon (sigmoidoscopy or colonoscopy).  How is this treated? This condition can usually be treated at home. However, various procedures may be done if dietary changes, lifestyle changes, and other home treatments do not help your symptoms. These procedures can help make the hemorrhoids smaller or remove them completely. Some of these procedures involve surgery, and others do not. Common procedures include:  Rubber band ligation. Rubber bands are placed at the base of the hemorrhoids to cut off the blood supply to them.  Sclerotherapy. Medicine is injected into the hemorrhoids to shrink them.  Infrared coagulation. A type of light energy is used to get rid of the hemorrhoids.  Hemorrhoidectomy surgery. The hemorrhoids are surgically removed, and the veins that supply them are tied off.  Stapled hemorrhoidopexy surgery. A circular stapling device is used to remove the hemorrhoids and use staples to cut off the blood supply to them.  Follow these instructions at home: Eating and drinking  Eat foods that have a lot of fiber in them, such as whole grains, beans, nuts, fruits, and vegetables. Ask your health care provider about taking products that have added fiber (fiber supplements).  Drink enough fluid to keep your urine clear or pale yellow. Managing pain and swelling  Take warm sitz baths for 20 minutes, 3-4 times a day to ease  pain and discomfort.  If directed, apply ice to the affected area. Using ice packs between sitz baths may be helpful. ? Put ice in a plastic bag. ? Place a towel between your skin and the bag. ? Leave the ice on for 20 minutes, 2-3 times a day. General instructions  Take over-the-counter and prescription medicines only as told by your health care provider.  Use medicated creams or  suppositories as told.  Exercise regularly.  Go to the bathroom when you have the urge to have a bowel movement. Do not wait.  Avoid straining to have bowel movements.  Keep the anal area dry and clean. Use wet toilet paper or moist towelettes after a bowel movement.  Do not sit on the toilet for long periods of time. This increases blood pooling and pain. Contact a health care provider if:  You have increasing pain and swelling that are not controlled by treatment or medicine.  You have uncontrolled bleeding.  You have difficulty having a bowel movement, or you are unable to have a bowel movement.  You have pain or inflammation outside the area of the hemorrhoids. This information is not intended to replace advice given to you by your health care provider. Make sure you discuss any questions you have with your health care provider. Document Released: 04/14/2000 Document Revised: 09/15/2015 Document Reviewed: 12/30/2014 Elsevier Interactive Patient Education  [employer]2018 [employer]

## 2020-06-09 DIAGNOSIS — Z20822 Contact with and (suspected) exposure to covid-19: Secondary | ICD-10-CM | POA: Diagnosis not present

## 2020-11-25 DIAGNOSIS — Z20822 Contact with and (suspected) exposure to covid-19: Secondary | ICD-10-CM | POA: Diagnosis not present

## 2021-01-19 DIAGNOSIS — K420 Umbilical hernia with obstruction, without gangrene: Secondary | ICD-10-CM | POA: Diagnosis not present

## 2021-01-20 DIAGNOSIS — K420 Umbilical hernia with obstruction, without gangrene: Secondary | ICD-10-CM | POA: Diagnosis not present

## 2021-02-07 DIAGNOSIS — K430 Incisional hernia with obstruction, without gangrene: Secondary | ICD-10-CM | POA: Diagnosis not present

## 2021-02-07 DIAGNOSIS — K420 Umbilical hernia with obstruction, without gangrene: Secondary | ICD-10-CM | POA: Diagnosis not present

## 2021-02-18 ENCOUNTER — Other Ambulatory Visit: Payer: Self-pay

## 2021-02-18 ENCOUNTER — Ambulatory Visit (INDEPENDENT_AMBULATORY_CARE_PROVIDER_SITE_OTHER): Payer: BC Managed Care – PPO | Admitting: Family

## 2021-02-18 ENCOUNTER — Encounter: Payer: Self-pay | Admitting: Family

## 2021-02-18 VITALS — BP 112/75 | HR 73 | Temp 98.4°F | Ht 63.0 in | Wt 136.0 lb

## 2021-02-18 DIAGNOSIS — M25549 Pain in joints of unspecified hand: Secondary | ICD-10-CM

## 2021-02-18 DIAGNOSIS — Z0000 Encounter for general adult medical examination without abnormal findings: Secondary | ICD-10-CM | POA: Diagnosis not present

## 2021-02-18 DIAGNOSIS — Z23 Encounter for immunization: Secondary | ICD-10-CM | POA: Diagnosis not present

## 2021-02-18 DIAGNOSIS — N951 Menopausal and female climacteric states: Secondary | ICD-10-CM

## 2021-02-18 DIAGNOSIS — M720 Palmar fascial fibromatosis [Dupuytren]: Secondary | ICD-10-CM | POA: Diagnosis not present

## 2021-02-18 HISTORY — DX: Encounter for immunization: Z23

## 2021-02-18 HISTORY — DX: Encounter for general adult medical examination without abnormal findings: Z00.00

## 2021-02-18 LAB — COMPREHENSIVE METABOLIC PANEL
ALT: 10 U/L (ref 0–35)
AST: 15 U/L (ref 0–37)
Albumin: 4.6 g/dL (ref 3.5–5.2)
Alkaline Phosphatase: 42 U/L (ref 39–117)
BUN: 12 mg/dL (ref 6–23)
CO2: 25 mEq/L (ref 19–32)
Calcium: 9.8 mg/dL (ref 8.4–10.5)
Chloride: 110 mEq/L (ref 96–112)
Creatinine, Ser: 0.84 mg/dL (ref 0.40–1.20)
GFR: 80.65 mL/min (ref >60.00)
Glucose, Bld: 97 mg/dL (ref 70–99)
Potassium: 5.1 mEq/L (ref 3.5–5.1)
Sodium: 146 mEq/L — ABNORMAL HIGH (ref 135–145)
Total Bilirubin: 0.5 mg/dL (ref 0.2–1.2)
Total Protein: 7.2 g/dL (ref 6.0–8.3)

## 2021-02-18 LAB — CBC WITH DIFFERENTIAL/PLATELET
Basophils Absolute: 0.1 10*3/uL (ref 0.0–0.1)
Basophils Relative: 1.8 % (ref 0.0–3.0)
Eosinophils Absolute: 0.2 10*3/uL (ref 0.0–0.7)
Eosinophils Relative: 3.1 % (ref 0.0–5.0)
HCT: 43.2 % (ref 36.0–46.0)
Hemoglobin: 14.3 g/dL (ref 12.0–15.0)
Lymphocytes Relative: 28.4 % (ref 12.0–46.0)
Lymphs Abs: 1.7 10*3/uL (ref 0.7–4.0)
MCHC: 33.1 g/dL (ref 30.0–36.0)
MCV: 88.5 fl (ref 78.0–100.0)
Monocytes Absolute: 0.5 10*3/uL (ref 0.1–1.0)
Monocytes Relative: 8.6 % (ref 3.0–12.0)
Neutro Abs: 3.5 10*3/uL (ref 1.4–7.7)
Neutrophils Relative %: 58.1 % (ref 43.0–77.0)
Platelets: 248 10*3/uL (ref 150.0–400.0)
RBC: 4.88 Mil/uL (ref 3.87–5.11)
RDW: 13.3 % (ref 11.5–15.5)
WBC: 6 10*3/uL (ref 4.0–10.5)

## 2021-02-18 LAB — LIPID PANEL
Cholesterol: 216 mg/dL — ABNORMAL HIGH (ref 0–200)
HDL: 47.9 mg/dL (ref >39.00)
LDL Cholesterol: 143 mg/dL — ABNORMAL HIGH (ref 0–99)
NonHDL: 168.59
Total CHOL/HDL Ratio: 5
Triglycerides: 130 mg/dL (ref 0.0–149.0)
VLDL: 26 mg/dL (ref 0.0–40.0)

## 2021-02-18 LAB — TSH: TSH: 2.3 u[IU]/mL (ref 0.35–5.50)

## 2021-02-18 NOTE — Assessment & Plan Note (Signed)
Burning, tingling sensation in multiple fingers. Pt types on computer during the day. Advised on using wrist braces overnight and during day if able for 2 weeks to help reduce symptoms. Also discussed wearing fingerless compression gloves during day.

## 2021-02-18 NOTE — Progress Notes (Signed)
Chief Complaint:  Rachel Castillo is a 50 y.o. female who presents today for her annual comprehensive physical exam.    Assessment/Plan:   Problem List Items Addressed This Visit       Musculoskeletal and Integument   Dupuytren contracture    Left hand, firm knot palpable, advised pt on home treatment, she is choosing to monitor for now before seeking referral.        Other   Annual physical exam    Ordering mammogram and cologuard today. Pt received her first Shingles vaccine and influenza today, she reports new perimenopausal sx today as well as other acute problems.      Relevant Orders   Cologuard   MM Digital Diagnostic Bilat   Comprehensive metabolic panel   TSH   Lipid panel   CBC with Differential/Platelet   Pain in multiple finger joints - Primary    Burning, tingling sensation in multiple fingers. Pt types on computer during the day. Advised on using wrist braces overnight and during day if able for 2 weeks to help reduce symptoms. Also discussed wearing fingerless compression gloves during day.      Perimenopausal symptom    Pt is still having monthly cycles. Advised that hrt would not be recommended right now. Discussed several OTC herbal medications including Black Cohosh, Jeffie Pollock, preferably getting through a health food store in organic, natural form. If these options are not helping, advised her to let me know and we can discuss other RX medications.      Need for immunization against influenza   Relevant Orders   Flu Vaccine QUAD 26mo+IM (Fluarix, Fluzone & Alfiuria Quad PF) (Completed)     Body mass index is 24.09 kg/m. / WNL  BMI Metric Follow Up - 02/18/21 0804       BMI Metric Follow Up-Please document annually   BMI Metric Follow Up Education provided              Preventative Healthcare: Pt received her Influenza and Shingles vaccines today. Discussed Colon cancer screening, including Colonoscopy, and she is choosing to do the Cologuard  screening today.  Patient Counseling(The following topics were reviewed and/or handout was given):  -Nutrition: Stressed importance of moderation in sodium/caffeine intake, saturated fat and cholesterol, caloric balance, sufficient intake of fresh fruits, vegetables, and fiber.  -Stressed the importance of regular exercise.   -Substance Abuse: Discussed cessation/primary prevention of tobacco, alcohol, or other drug use; driving or other dangerous activities under the influence; availability of treatment for abuse.   -Injury prevention: Discussed safety belts, safety helmets, smoke detector, smoking near bedding or upholstery.   -Sexuality: Discussed sexually transmitted diseases, partner selection, use of condoms, avoidance of unintended pregnancy and contraceptive alternatives.   -Dental health: Discussed importance of regular tooth brushing, flossing, and dental visits.  -Health maintenance and immunizations reviewed. Please refer to Health maintenance section.  Return to care in 1 year for next preventative visit or sooner as needed.     Subjective:  HPI:  New/Acute Problems: Joint/Muscle Pain: Patient complains of arthralgias for which has been present for a few months. Pain is located in bilateral hands, is described as decreased range of motion, and is constant. Associated symptoms include: burning pain, tingling in multiple fingers on both hands. The patient has tried nothing for pain relief.  Related to injury:   no.  Perimenopausal symptoms:  Pt c/o moodiness, hot flashes and night sweats, weight gain. Denies vaginal dryness, dyspareunia, or insomnia. Pt  has  not tried anything OTC for symptoms.    Duputyrene's contracture:  Left hand, firm band of tissue noted proximal to 4th digit, palmar side. Pt reports pain occasionally.  Chronic Problems Addressed Today: No problem-specific Assessment & Plan notes found for this encounter.  Review of Systems - Review of Systems   Constitutional:  Positive for diaphoresis (night sweats).  HENT: Negative.    Eyes: Negative.   Respiratory: Negative.    Cardiovascular: Negative.   Gastrointestinal: Negative.   Genitourinary: Negative.   Musculoskeletal:  Positive for myalgias (bilateral hands).  Skin: Negative.   Neurological:  Positive for tingling (bilateral hands).  Endo/Heme/Allergies: Negative.        Hot flashes  Psychiatric/Behavioral:  Hallucinations: moodiness. The patient is nervous/anxious.       Depression screen PHQ 2/9 02/18/2021  Decreased Interest 0  Down, Depressed, Hopeless 0  PHQ - 2 Score 0  Altered sleeping -  Tired, decreased energy -  Change in appetite -  Feeling bad or failure about yourself  -  Trouble concentrating -  Moving slowly or fidgety/restless -  Suicidal thoughts -  PHQ-9 Score -  Difficult doing work/chores -    Health Maintenance Due  Topic Date Due   Pneumococcal Vaccine 59-53 Years old (1 - PCV) Never done   Hepatitis C Screening  Never done   COLONOSCOPY (Pts 45-65yrs Insurance coverage will need to be confirmed)  Never done   MAMMOGRAM  Never done   COVID-19 Vaccine (4 - Booster for Moderna series) 05/02/2020         Objective:  Physical Exam: BP 112/75   Pulse 73   Temp 98.4 F (36.9 C) (Temporal)   Ht 5\' 3"  (1.6 m)   Wt 136 lb (61.7 kg)   LMP 01/31/2021 (Approximate)   SpO2 98%   BMI 24.09 kg/m   Body mass index is 24.09 kg/m. Wt Readings from Last 3 Encounters:  02/18/21 136 lb (61.7 kg)  08/22/17 125 lb (56.7 kg)  08/20/17 125 lb 3.2 oz (56.8 kg)   Gen: NAD, resting comfortably. HEENT: TMs normal bilaterally. OP clear. No thyromegaly noted.  CV: RRR with no murmurs appreciated Pulm: NWOB, CTAB with no crackles, wheezes, or rhonchi GI: Normal bowel sounds present. Soft, Nontender, Nondistended. MSK: no edema, cyanosis, or clubbing noted; firm band of tissue noted on left palmar, proximal to 4th digit. Skin: warm, dry Neuro: CN2-12  grossly intact. Strength 5/5 in upper and lower extremities. Reflexes symmetric and intact bilaterally.  Psych: Normal affect and thought content

## 2021-02-18 NOTE — Assessment & Plan Note (Signed)
Left hand, firm knot palpable, advised pt on home treatment, she is choosing to monitor for now before seeking referral.

## 2021-02-18 NOTE — Assessment & Plan Note (Signed)
Pt is still having monthly cycles. Advised that hrt would not be recommended right now. Discussed several OTC herbal medications including Black Cohosh, Jeffie Pollock, preferably getting through a health food store in organic, natural form. If these options are not helping, advised her to let me know and we can discuss other RX medications.

## 2021-02-18 NOTE — Patient Instructions (Signed)
Welcome to [facility] at [facility]! It was a pleasure meeting you today.   As we discussed, you can try OTC Black cohosh herbal supplement for your hot flashes/night sweat symptoms.  Also, Jeffie Pollock is OTC that has several herbal ingredients to help with different symptoms: moodiness, sleep, hot flashes, etc.   For your hand arthritis you can try to wear compression fingerless gloves during the day for warmth, and can try the herbal supplement Turmeric three times daily which is a natural anti-inflammatory med. You should also pick up the wrist braces we discussed and wear every night while sleeping for 2 weeks and during the day as able to help with the tingling & pain in your fingers. With all herbal OTC meds try to find organic through a food store, or natural source and follow the directions on the bottle. If these have side effects or are not working as well, let me know and we can discuss other options.  PLEASE NOTE:  If you had any LAB tests please let us know if you have not heard back within a few days. You may see your results on MyChart before we have a chance to review them but we will give you a call once they are reviewed by [place]. If we ordered any REFERRALS today, please let us know if you have not heard from their office within the next week.  Let us know through MyChart if you are needing refills, or have your pharmacy send [place] the request. You can also use MyChart to communicate with me or any office staff.  Please try these tips to maintain a healthy lifestyle:  Eat most of your calories during the day when you are active. Eliminate processed foods including packaged sweets (pies, cakes, cookies), reduce intake of potatoes, white bread, white pasta, and white rice. Look for whole grain options, oat flour or almond flour.  Each meal should contain half fruits/vegetables, one quarter protein, and one quarter carbs (no bigger than a computer mouse).  Cut down on sweet  beverages. This includes juice, soda, and sweet tea. Also watch fruit intake, though this is a healthier sweet option, it still contains natural sugar! Limit to 3 servings daily.  Drink at least 1 glass of water with each meal and aim for at least 8 glasses per day  Exercise at least 150 minutes every week.

## 2021-02-18 NOTE — Assessment & Plan Note (Signed)
Ordering mammogram and cologuard today. Pt received her first Shingles vaccine and influenza today, she reports new perimenopausal sx today as well as other acute problems.

## 2021-02-27 NOTE — Progress Notes (Signed)
Please call pt with lab results, she has not viewed message on MyChart, thx.

## 2021-03-08 ENCOUNTER — Other Ambulatory Visit: Payer: Self-pay

## 2021-03-08 DIAGNOSIS — E7889 Other lipoprotein metabolism disorders: Secondary | ICD-10-CM

## 2021-03-08 DIAGNOSIS — Z0000 Encounter for general adult medical examination without abnormal findings: Secondary | ICD-10-CM

## 2021-03-22 ENCOUNTER — Other Ambulatory Visit: Payer: Self-pay | Admitting: Family

## 2021-03-22 DIAGNOSIS — Z1231 Encounter for screening mammogram for malignant neoplasm of breast: Secondary | ICD-10-CM

## 2021-05-04 ENCOUNTER — Ambulatory Visit
Admission: RE | Admit: 2021-05-04 | Discharge: 2021-05-04 | Disposition: A | Discharge status: Home / Self Care | Payer: BC Managed Care – PPO | Source: Ambulatory Visit | Attending: Family | Admitting: Family

## 2021-05-04 DIAGNOSIS — Z1231 Encounter for screening mammogram for malignant neoplasm of breast: Secondary | ICD-10-CM

## 2021-05-06 ENCOUNTER — Other Ambulatory Visit: Payer: Self-pay | Admitting: Family

## 2021-05-06 DIAGNOSIS — R928 Other abnormal and inconclusive findings on diagnostic imaging of breast: Secondary | ICD-10-CM

## 2021-05-09 NOTE — Progress Notes (Signed)
Diagnostic mammo & U/S ordered.

## 2021-06-01 ENCOUNTER — Ambulatory Visit
Admission: RE | Admit: 2021-06-01 | Discharge: 2021-06-01 | Disposition: A | Discharge status: Home / Self Care | Payer: BC Managed Care – PPO | Source: Ambulatory Visit | Attending: Family | Admitting: Family

## 2021-06-01 ENCOUNTER — Other Ambulatory Visit: Payer: Self-pay | Admitting: Family

## 2021-06-01 DIAGNOSIS — N6489 Other specified disorders of breast: Secondary | ICD-10-CM

## 2021-06-01 DIAGNOSIS — R922 Inconclusive mammogram: Secondary | ICD-10-CM | POA: Diagnosis not present

## 2021-06-01 DIAGNOSIS — R928 Other abnormal and inconclusive findings on diagnostic imaging of breast: Secondary | ICD-10-CM

## 2021-06-15 ENCOUNTER — Other Ambulatory Visit: Payer: Self-pay

## 2021-06-15 ENCOUNTER — Ambulatory Visit: Payer: BC Managed Care – PPO | Admitting: Family

## 2021-06-15 ENCOUNTER — Ambulatory Visit (INDEPENDENT_AMBULATORY_CARE_PROVIDER_SITE_OTHER): Payer: BC Managed Care – PPO

## 2021-06-15 ENCOUNTER — Other Ambulatory Visit: Payer: BC Managed Care – PPO

## 2021-06-15 DIAGNOSIS — Z23 Encounter for immunization: Secondary | ICD-10-CM | POA: Diagnosis not present

## 2021-11-30 ENCOUNTER — Ambulatory Visit: Admission: RE | Admit: 2021-11-30 | Payer: BC Managed Care – PPO | Source: Ambulatory Visit

## 2021-11-30 ENCOUNTER — Ambulatory Visit
Admission: RE | Admit: 2021-11-30 | Discharge: 2021-11-30 | Disposition: A | Discharge status: Home / Self Care | Payer: BC Managed Care – PPO | Source: Ambulatory Visit | Attending: Family | Admitting: Family

## 2021-11-30 ENCOUNTER — Other Ambulatory Visit: Payer: Self-pay | Admitting: Family

## 2021-11-30 DIAGNOSIS — R928 Other abnormal and inconclusive findings on diagnostic imaging of breast: Secondary | ICD-10-CM

## 2021-11-30 DIAGNOSIS — N6489 Other specified disorders of breast: Secondary | ICD-10-CM

## 2022-01-23 ENCOUNTER — Encounter: Payer: Self-pay | Admitting: *Deleted

## 2022-04-13 ENCOUNTER — Encounter: Payer: Self-pay | Admitting: *Deleted

## 2022-04-17 ENCOUNTER — Ambulatory Visit (INDEPENDENT_AMBULATORY_CARE_PROVIDER_SITE_OTHER): Payer: BC Managed Care – PPO | Admitting: Family

## 2022-04-17 ENCOUNTER — Encounter: Payer: Self-pay | Admitting: Family

## 2022-04-17 VITALS — BP 118/82 | HR 61 | Temp 97.4°F | Ht 63.0 in | Wt 142.1 lb

## 2022-04-17 DIAGNOSIS — E782 Mixed hyperlipidemia: Secondary | ICD-10-CM | POA: Diagnosis not present

## 2022-04-17 DIAGNOSIS — N951 Menopausal and female climacteric states: Secondary | ICD-10-CM

## 2022-04-17 DIAGNOSIS — M720 Palmar fascial fibromatosis [Dupuytren]: Secondary | ICD-10-CM | POA: Diagnosis not present

## 2022-04-17 DIAGNOSIS — Z0000 Encounter for general adult medical examination without abnormal findings: Secondary | ICD-10-CM

## 2022-04-17 DIAGNOSIS — Z1211 Encounter for screening for malignant neoplasm of colon: Secondary | ICD-10-CM

## 2022-04-17 LAB — COMPREHENSIVE METABOLIC PANEL
ALT: 9 U/L (ref 0–35)
AST: 15 U/L (ref 0–37)
Albumin: 4.4 g/dL (ref 3.5–5.2)
Alkaline Phosphatase: 43 U/L (ref 39–117)
BUN: 11 mg/dL (ref 6–23)
CO2: 28 mEq/L (ref 19–32)
Calcium: 9.7 mg/dL (ref 8.4–10.5)
Chloride: 110 mEq/L (ref 96–112)
Creatinine, Ser: 0.88 mg/dL (ref 0.40–1.20)
GFR: 75.65 mL/min (ref >60.00)
Glucose, Bld: 109 mg/dL — ABNORMAL HIGH (ref 70–99)
Potassium: 4.9 mEq/L (ref 3.5–5.1)
Sodium: 148 mEq/L — ABNORMAL HIGH (ref 135–145)
Total Bilirubin: 0.6 mg/dL (ref 0.2–1.2)
Total Protein: 6.6 g/dL (ref 6.0–8.3)

## 2022-04-17 LAB — LIPID PANEL
Cholesterol: 238 mg/dL — ABNORMAL HIGH (ref 0–200)
HDL: 55.7 mg/dL (ref >39.00)
LDL Cholesterol: 161 mg/dL — ABNORMAL HIGH (ref 0–99)
NonHDL: 182.12
Total CHOL/HDL Ratio: 4
Triglycerides: 104 mg/dL (ref 0.0–149.0)
VLDL: 20.8 mg/dL (ref 0.0–40.0)

## 2022-04-17 NOTE — Assessment & Plan Note (Signed)
pt reports only missing one cycle this year pt has been taking Estroven with some relief, reminded pt ok to add Black Cohosh to the [place] pt too busy to notice too many sx continue to advise on limiting caffeine, dressing in cool layers, etc f/u prn

## 2022-04-17 NOTE — Assessment & Plan Note (Signed)
chronic pt admits to not changing her diet too much, working long hours, very little to no exercise advised on prioritizing her health rechecking lipids  f/u 1 year

## 2022-04-17 NOTE — Progress Notes (Signed)
Phone [phone]  Subjective:   Patient is a 51 y.o. female presenting for annual physical.    Chief Complaint  Patient presents with   Annual Exam    Fasting W/Labs   HPI: Perimenopausal symptoms:  Pt c/o moodiness, hot flashes and night sweats, weight gain. Denies vaginal dryness, dyspareunia, or insomnia. Pt  has not tried anything OTC for symptoms.   Joint/Muscle Pain: Patient complains of arthralgias for which has been present for a few months. Pain is located in bilateral hands, is described as decreased range of motion, and is constant. Associated symptoms include: burning pain, tingling in multiple fingers on both hands. The patient has tried nothing for pain relief.  Duputyrene's contracture:  Left hand, firm band of tissue noted proximal to 4th digit, palmar side. Pt reports pain occasionally. Hyperlipidemia: Patient is currently maintained on the following medication for hyperlipidemia: none. Patient reports fair compliance with low fat/low cholesterol diet.  Last lipid panel as follows: Lab Results  Component Value Date   CHOL 216 (H) 02/18/2021   HDL 47.90 02/18/2021   LDLCALC 143 (H) 02/18/2021   TRIG 130.0 02/18/2021   CHOLHDL 5 02/18/2021   See problem oriented charting- ROS- full  review of systems was completed and negative except for: finger pain & hyperlipidemia noted in HPI above.  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   Past Surgical History:  Procedure Laterality Date   CESAREAN SECTION  1999   CYST EXCISION     TONSILLECTOMY     Family History  Problem Relation Age of Onset   Hearing loss Mother    Hypertension Mother    Hyperlipidemia Father    Diabetes Father    Alcohol abuse Father    Heart attack Maternal Grandfather    Heart attack Paternal Grandfather    Breast cancer Cousin    Healthy Brother    Healthy Son    Cancer Neg Hx     Medications- reviewed and updated Current Outpatient Medications  Medication Sig Dispense Refill   albuterol (PROVENTIL HFA;VENTOLIN HFA) 108 (90 BASE) MCG/ACT inhaler Inhale into the lungs every 6 (six) hours as needed for wheezing or shortness of breath.     Ascorbic Acid (VITAMIN C) 1000 MG tablet Take 1,000 mg by mouth daily.     azelastine (ASTELIN) 0.1 % nasal spray Place into the nose.     COLLAGEN PO Take 20 mg by mouth daily.     fexofenadine (ALLEGRA) 180 MG tablet Take 180 mg by mouth as needed.      fluticasone (FLONASE) 50 MCG/ACT nasal spray Place 2 sprays into both nostrils daily. 16 g 0   hydrocortisone (PROCTOCORT) 1 % CREA Apply to affected hemorrhoid twice daily as needed 1 Tube 1   No current facility-administered medications for this visit.    Allergies-reviewed and updated Allergies  Allergen Reactions   Dust Mite Extract Shortness Of Breath   Other Shortness  Of Breath    "roach"    Social History   Social History Narrative   Not on file    Objective:  BP 118/82 (BP Location: Left Arm, Patient Position: Sitting, Cuff Size: Large)   Pulse 61   Temp (!) 97.4 F (36.3 C) (Temporal)   Ht _0  (1.6 m)   Wt 142 lb 2 oz (64.5 kg)   LMP 04/15/2022 (Exact Date)   SpO2 100%   BMI 25.18 kg/m  Physical Exam Vitals and nursing note reviewed.  Constitutional:      Appearance: Normal appearance.  HENT:     Head: Normocephalic.     Right Ear: Tympanic membrane normal.     Left Ear: Tympanic  membrane normal.     Nose: Nose normal.     Mouth/Throat:     Mouth: Mucous membranes are moist.  Eyes:     Pupils: Pupils are equal, round, and reactive to light.  Cardiovascular:     Rate and Rhythm: Normal rate and regular rhythm.  Pulmonary:     Effort: Pulmonary effort is normal.     Breath sounds: Normal breath sounds.  Musculoskeletal:        General: Normal range of motion.     Cervical back: Normal range of motion.     Comments: firm band of tissue noted on left palmar, proximal to 4th digit.    Lymphadenopathy:     Cervical: No cervical adenopathy.  Skin:    General: Skin is warm and dry.  Neurological:     Mental Status: She is alert.  Psychiatric:        Mood and Affect: Mood normal.        Behavior: Behavior normal.      Assessment and Plan   Health Maintenance counseling: 1. Anticipatory guidance: Patient counseled regarding regular dental exams q6 months, eye exams,  avoiding smoking and second hand smoke, limiting alcohol to 1 beverage per day, no illicit drugs.   2. Risk factor reduction:  Advised patient of need for regular exercise and diet rich with fruits and vegetables to reduce risk of heart attack and stroke. Exercise- rarely, pt has been too busy with work.  Wt Readings from Last 3 Encounters:  04/17/22 142 lb 2 oz (64.5 kg)  02/18/21 136 lb (61.7 kg)  08/22/17 125 lb (56.7 kg)   3. Immunizations/screenings/ancillary studies Immunization History  Administered Date(s) Administered   Influenza,inj,Quad PF,6+ Mos 04/03/2017, 02/18/2021, 03/22/2022   Moderna Sars-Covid-2 Vaccination 07/02/2019, 07/31/2019, 03/07/2020   Tdap 03/16/2016   Zoster Recombinat (Shingrix) 02/18/2021, 06/15/2021   Health Maintenance Due  Topic Date Due   Hepatitis C Screening  Never done   PAP SMEAR-Modifier  04/03/2022    4. Cervical cancer screening- due 5. Breast cancer screening-  mammogram: last done 2023 6. Colon cancer screening - due, ordering again today 7.  Skin cancer screening- advised regular sunscreen use. Denies worrisome, changing, or new skin lesions.  8. Birth control/STD check- N/A - pt still menstruating 9. Osteoporosis screening- N/A 10. Alcohol screening: rare 11. Smoking associated screening (lung cancer screening, AAA screen 65-75, UA)- non- smoker  Problem List Items Addressed This Visit       Musculoskeletal and Integument   Dupuytren contracture    chronic left hand, palpable knot pt ready to have assessed and see options, but is afraid to have injection sending referral to Sports med      Relevant Orders   Ambulatory referral to Sports Medicine  Other   Perimenopausal symptom    pt reports only missing one cycle this year pt has been taking Estroven with some relief, reminded pt ok to add Black Cohosh to the [place] pt too busy to notice too many sx continue to advise on limiting caffeine, dressing in cool layers, etc f/u prn      Mixed hyperlipidemia    chronic pt admits to not changing her diet too much, working long hours, very little to no exercise advised on prioritizing her health rechecking lipids  f/u 1 year      Relevant Orders   Lipid panel   Comp Met (CMET)   Other Visit Diagnoses     Annual physical exam    -  Primary   Relevant Orders   Comp Met (CMET)   Screening for colon cancer       Relevant Orders   Cologuard       Recommended follow up: Return for PAP smear, any future concerns. Future Appointments  Date Time Provider [place]  05/05/2022  8:40 AM [facility]    Lab/Order associations: fasting   Jeanie Sewer, NP

## 2022-04-17 NOTE — Assessment & Plan Note (Addendum)
chronic left hand, palpable knot pt ready to have assessed and see options, but is afraid to have injection sending referral to Sports med

## 2022-04-17 NOTE — Patient Instructions (Signed)
It was very nice to see you today!   I will review your lab results via MyChart in a few days.   Hope you have a wonderful Christmas!    PLEASE NOTE:  If you had any lab tests please let us know if you have not heard back within a few days. You may see your results on MyChart before we have a chance to review them but we will give you a call once they are reviewed by [place]. If we ordered any referrals today, please let us know if you have not heard from their office within the next week.

## 2022-04-21 NOTE — Progress Notes (Signed)
Your electrolytes, blood count, thyroid, liver & kidney function are all normal. However, your sodium is a little high again, have to drink more water - at least 64oz = 8 cups every day! Your glucose is a little high if you had fasted prior to the labs (no food or drink other than water). Eat less simple carbs: white bread, biscuits, sweets, etc  Your total cholesterol number & LDL (bad #) are higher than last time Need to reduce any fried foods, alcohol, nonnutritional snacks e.g. chips/cookies,pies, cakes and candies, fatty meat (red meat), high fat dairy foods:  including cheese, milk, ice cream.  Increase fruits/vegetables/fiber in your diet. Continue or restart an exercise routine, shooting for 5-7days per week.   I know you said during our visit that your diet has not been good lately, so get back on track, you can do it :-)

## 2022-04-27 DIAGNOSIS — Z1211 Encounter for screening for malignant neoplasm of colon: Secondary | ICD-10-CM | POA: Diagnosis not present

## 2022-04-27 LAB — HM COLONOSCOPY

## 2022-04-27 LAB — COLOGUARD: Cologuard: NEGATIVE

## 2022-05-03 LAB — COLOGUARD: COLOGUARD: NEGATIVE

## 2022-05-04 ENCOUNTER — Ambulatory Visit: Payer: BC Managed Care – PPO | Admitting: Sports Medicine

## 2022-05-04 VITALS — BP 112/70 | Ht 62.0 in | Wt 135.0 lb

## 2022-05-04 DIAGNOSIS — M720 Palmar fascial fibromatosis [Dupuytren]: Secondary | ICD-10-CM | POA: Diagnosis not present

## 2022-05-04 NOTE — Progress Notes (Addendum)
   New Patient Office Visit  Subjective   Patient ID: Rachel Castillo, female    DOB: 1970-07-27  Age: 52 y.o. MRN: [id number]  CC: Knot in left hand   HPI: Rachel Castillo is a pleasant 52 year old female referred from her PCP for evaluation of Dupuytren's contracture. She developed a nodule in her left hand about 2 years ago and noticed increased in size and mild tenderness of the nodule about 1 year ago. She feels like it is a little more difficult to extend the fourth digit of her right hand compared to other fingers. Holding a hot drink in her hand helps. She also has experienced a few extremely painful episodes in the last year that she describes as a "rush of electricity" from the area of the nodule to the webbing of her 2nd, 3rd, and 4th digits. These episodes only last a few seconds but have made her tear up and drop cups. Most recent episode was in November. She notices that the spaces between her 2nd-4th digits can be sensitive even days after the episodes. 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 She is opposed to injections as she had a very painful experience with a hemorrhoid excision in the past.     Objective:     BP 112/70   Ht 5\' 2"  (1.575 m)   Wt 135 lb (61.2 kg)   LMP 04/15/2022 (Exact Date)   BMI 24.69 kg/m   Physical Exam  Left Palm: Visible nodule present on palmar surface proximal to fourth digit and superior to distal palmar crease. Palpable cord present, mildly tender. Mild tenderness to palpation of interdigital fold between 2nd and 3rd digit. Full extension and flexion of digits. Negative tabletop test.   Right Palm: No visible nodules. Palpable nodule present on palmar surface proximal to fourth digit, smaller than nodule on L palm. Nontender to palpation. Full extension and flexion of digits. Negative tabletop test.     Assessment & Plan:   Problem List Items Addressed This Visit       Musculoskeletal and Integument   Dupuytren's  contracture of both hands - Primary    Present on both right and left palm, worse on left. Not currently inhibiting work or preventing full extension - tabletop test negative. Also appears to be experiencing nerve pain in her digits but unclear if related to Dupuytren's. Will refer to PT for hand-strengthening exercises. She currently takes collagen supplements, did not find any research evidence indicating that supplementation can contribute to Dupuytren's but discussed discontinuing for 4-6 weeks to see if she notices a difference. Recommended hand warmer for relief. Follow-up as needed. She will let us know if it worsens and she wants to see a hand surgeon for collagenase injection, but would like to avoid that for now.        Stefani Dama, Medical Student   Patient seen and evaluated with the medical student.  I agree with the above plan of care.  Patient's presentation is classic for Dupuytren's contracture of both hands.  However, this is not severe.  Patient would like to try a little physical therapy to see if this is beneficial.  She also understands that a referral can be made to the hand surgeon for possible collagenase injections if her symptoms worsen.  Follow-up as needed.

## 2022-05-04 NOTE — Patient Instructions (Signed)
It was great meeting you!   The knots in your hand are something called Dupuytryen's contracture. We will send a referral to Occupational Therapy (OT) to help with hand strengthening exercises. While there does not appear to be research showing that collagen supplementation worsens Dupuytren's, you can try stopping your supplement for 4-6 weeks and seeing if you notice a difference in your hand.   Please let us know if it worsens and you are unable to extend your fingers. We can refer you to a hand surgeon who can give you the collagenase injection that breaks down the collagen causing the knots/nodules.

## 2022-05-04 NOTE — Assessment & Plan Note (Signed)
Present on both right and left palm, worse on left. Not currently inhibiting work or preventing full extension - tabletop test negative. Also appears to be experiencing nerve pain in her digits but unclear if related to Dupuytren's. Will refer to PT for hand-strengthening exercises. She currently takes collagen supplements, did not find any research evidence indicating that supplementation can contribute to Dupuytren's but discussed discontinuing for 4-6 weeks to see if she notices a difference. Recommended hand warmer for relief. Follow-up as needed. She will let us know if it worsens and she wants to see a hand surgeon for collagenase injection, but would like to avoid that for now.

## 2022-05-05 ENCOUNTER — Ambulatory Visit
Admission: RE | Admit: 2022-05-05 | Discharge: 2022-05-05 | Disposition: A | Discharge status: Home / Self Care | Payer: BC Managed Care – PPO | Source: Ambulatory Visit | Attending: Family | Admitting: Family

## 2022-05-05 DIAGNOSIS — N6489 Other specified disorders of breast: Secondary | ICD-10-CM

## 2022-05-05 DIAGNOSIS — R922 Inconclusive mammogram: Secondary | ICD-10-CM | POA: Diagnosis not present

## 2022-05-05 DIAGNOSIS — R928 Other abnormal and inconclusive findings on diagnostic imaging of breast: Secondary | ICD-10-CM

## 2022-05-08 NOTE — Progress Notes (Signed)
Your Cologuard testing is negative! Retest in 3 years or you can have a Colonoscopy at that time.  Take care!

## 2022-05-09 NOTE — Progress Notes (Signed)
I see that your Mammogram came back needing further testing. I hope the imaging center has called you to schedule. Let me know if you have not been notified.

## 2022-05-10 NOTE — Progress Notes (Signed)
I see that you refused to have the right breast biopsy done due wanting it done under general anesthesia. They do numb the area first so you don't feel the biopsy needle. Did you ask about general anesthesia and they told you specifically that they would not it? I know there is no palpable lump in your breast, but there could be cancer cells growing, and this is the 3rd mammogram showing the same area of concern, so I do recommend you get the biopsy done. But let me know what they told you.

## 2022-05-25 DIAGNOSIS — Z713 Dietary counseling and surveillance: Secondary | ICD-10-CM | POA: Diagnosis not present

## 2022-05-26 DIAGNOSIS — M720 Palmar fascial fibromatosis [Dupuytren]: Secondary | ICD-10-CM | POA: Diagnosis not present

## 2022-05-26 DIAGNOSIS — M25541 Pain in joints of right hand: Secondary | ICD-10-CM | POA: Diagnosis not present

## 2022-05-26 DIAGNOSIS — M25542 Pain in joints of left hand: Secondary | ICD-10-CM | POA: Diagnosis not present

## 2022-05-26 DIAGNOSIS — M6281 Muscle weakness (generalized): Secondary | ICD-10-CM | POA: Diagnosis not present

## 2022-06-13 DIAGNOSIS — M720 Palmar fascial fibromatosis [Dupuytren]: Secondary | ICD-10-CM | POA: Diagnosis not present

## 2022-06-13 DIAGNOSIS — M6281 Muscle weakness (generalized): Secondary | ICD-10-CM | POA: Diagnosis not present

## 2022-06-13 DIAGNOSIS — M25541 Pain in joints of right hand: Secondary | ICD-10-CM | POA: Diagnosis not present

## 2022-06-13 DIAGNOSIS — M25542 Pain in joints of left hand: Secondary | ICD-10-CM | POA: Diagnosis not present

## 2023-05-11 ENCOUNTER — Encounter: Payer: Self-pay | Admitting: Family

## 2023-05-11 ENCOUNTER — Ambulatory Visit (INDEPENDENT_AMBULATORY_CARE_PROVIDER_SITE_OTHER): Payer: BC Managed Care – PPO | Admitting: Family

## 2023-05-11 VITALS — BP 112/74 | HR 71 | Temp 97.7°F | Ht 62.0 in | Wt 139.0 lb

## 2023-05-11 DIAGNOSIS — Z1159 Encounter for screening for other viral diseases: Secondary | ICD-10-CM

## 2023-05-11 DIAGNOSIS — Z0000 Encounter for general adult medical examination without abnormal findings: Secondary | ICD-10-CM | POA: Diagnosis not present

## 2023-05-11 DIAGNOSIS — E782 Mixed hyperlipidemia: Secondary | ICD-10-CM | POA: Diagnosis not present

## 2023-05-11 DIAGNOSIS — Z01419 Encounter for gynecological examination (general) (routine) without abnormal findings: Secondary | ICD-10-CM

## 2023-05-11 DIAGNOSIS — N951 Menopausal and female climacteric states: Secondary | ICD-10-CM

## 2023-05-11 DIAGNOSIS — Z23 Encounter for immunization: Secondary | ICD-10-CM | POA: Diagnosis not present

## 2023-05-11 LAB — CBC WITH DIFFERENTIAL/PLATELET
Basophils Absolute: 0.1 10*3/uL (ref 0.0–0.1)
Basophils Relative: 1.2 % (ref 0.0–3.0)
Eosinophils Absolute: 0.2 10*3/uL (ref 0.0–0.7)
Eosinophils Relative: 3.5 % (ref 0.0–5.0)
HCT: 42 % (ref 36.0–46.0)
Hemoglobin: 14.2 g/dL (ref 12.0–15.0)
Lymphocytes Relative: 31.7 % (ref 12.0–46.0)
Lymphs Abs: 1.7 10*3/uL (ref 0.7–4.0)
MCHC: 33.8 g/dL (ref 30.0–36.0)
MCV: 88.2 fL (ref 78.0–100.0)
Monocytes Absolute: 0.4 10*3/uL (ref 0.1–1.0)
Monocytes Relative: 8.1 % (ref 3.0–12.0)
Neutro Abs: 2.9 10*3/uL (ref 1.4–7.7)
Neutrophils Relative %: 55.5 % (ref 43.0–77.0)
Platelets: 287 10*3/uL (ref 150.0–400.0)
RBC: 4.76 Mil/uL (ref 3.87–5.11)
RDW: 13.5 % (ref 11.5–15.5)
WBC: 5.2 10*3/uL (ref 4.0–10.5)

## 2023-05-11 LAB — LIPID PANEL
Cholesterol: 223 mg/dL — ABNORMAL HIGH (ref 0–200)
HDL: 47.2 mg/dL (ref >39.00)
LDL Cholesterol: 159 mg/dL — ABNORMAL HIGH (ref 0–99)
NonHDL: 175.72
Total CHOL/HDL Ratio: 5
Triglycerides: 86 mg/dL (ref 0.0–149.0)
VLDL: 17.2 mg/dL (ref 0.0–40.0)

## 2023-05-11 LAB — COMPREHENSIVE METABOLIC PANEL
ALT: 8 U/L (ref 0–35)
AST: 13 U/L (ref 0–37)
Albumin: 4.2 g/dL (ref 3.5–5.2)
Alkaline Phosphatase: 47 U/L (ref 39–117)
BUN: 14 mg/dL (ref 6–23)
CO2: 25 meq/L (ref 19–32)
Calcium: 9.1 mg/dL (ref 8.4–10.5)
Chloride: 106 meq/L (ref 96–112)
Creatinine, Ser: 0.75 mg/dL (ref 0.40–1.20)
GFR: 90.97 mL/min (ref >60.00)
Glucose, Bld: 90 mg/dL (ref 70–99)
Potassium: 3.9 meq/L (ref 3.5–5.1)
Sodium: 138 meq/L (ref 135–145)
Total Bilirubin: 0.3 mg/dL (ref 0.2–1.2)
Total Protein: 6.6 g/dL (ref 6.0–8.3)

## 2023-05-11 NOTE — Assessment & Plan Note (Signed)
 Chronic, stable pt reports her cycle stopped in August but restarted last week of Dec. pt has been taking Estroven with some relief, reminded pt ok to add Black Cohosh to the [place] sending to GYN for PAP, advised to ask about Vehozah or HRT  continue to advise on limiting caffeine, dressing in cool layers, etc f/u prn

## 2023-05-11 NOTE — Progress Notes (Signed)
 Phone [phone]  Subjective:   Patient is a 53 y.o. female presenting for annual physical.    Chief Complaint  Patient presents with   Annual Exam    Fasting w/ labs   HPI: Perimenopausal symptoms:  Pt c/o moodiness, hot flashes and night sweats, weight gain. Denies vaginal dryness, dyspareunia, or insomnia. Pt has tried Estroven & black cohosh OTC for symptoms which have helped a little. Hyperlipidemia: Patient is currently maintained on the following medication for hyperlipidemia: none. Patient reports fair compliance with low fat/low cholesterol diet.  Last lipid panel as follows: Lab Results  Component Value Date   CHOL 238 (H) 04/17/2022   HDL 55.70 04/17/2022   LDLCALC 161 (H) 04/17/2022   TRIG 104.0 04/17/2022   CHOLHDL 4 04/17/2022   See problem oriented charting- ROS- full  review of systems was completed and negative except for: finger pain & hyperlipidemia noted in HPI above.  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   Past Surgical History:  Procedure Laterality Date   CESAREAN SECTION  1999   CYST EXCISION     TONSILLECTOMY     Family History  Problem Relation Age of Onset   Hearing loss Mother    Hypertension Mother    Hyperlipidemia Father    Diabetes Father    Alcohol abuse Father    Heart attack Maternal Grandfather    Heart attack Paternal Grandfather    Breast cancer  Cousin    Healthy Brother    Healthy Son    Cancer Neg Hx     Medications- reviewed and updated Current Outpatient Medications  Medication Sig Dispense Refill   albuterol (PROVENTIL HFA;VENTOLIN HFA) 108 (90 BASE) MCG/ACT inhaler Inhale into the lungs every 6 (six) hours as needed for wheezing or shortness of breath.     Ascorbic Acid (VITAMIN C) 1000 MG tablet Take 1,000 mg by mouth daily.     Ascorbic Acid (VITAMIN C) 1000 MG tablet Take 1,000 mg by mouth daily.     COLLAGEN PO Take 20 mg by mouth daily.     Docusate Sodium (DSS) 100 MG CAPS Take by mouth.     fexofenadine (ALLEGRA) 180 MG tablet Take 180 mg by mouth as needed.      fluticasone  (FLONASE ) 50 MCG/ACT nasal spray Place 2 sprays into both nostrils daily. 16 g 0   hydrocortisone  (PROCTOCORT ) 1 % CREA Apply to affected hemorrhoid twice daily as needed 1 Tube 1   azelastine (ASTELIN) 0.1 % nasal spray Place into the nose.     No current facility-administered medications for this visit.    Allergies-reviewed and updated Allergies  Allergen Reactions   Dust Mite Extract Shortness Of Breath   Other Shortness Of Breath    roach    Social History   Social History Narrative   Not on file   Objective:  BP 112/74 (BP Location: Left Arm, Patient Position: Sitting,  Cuff Size: Normal)   Pulse 71   Temp 97.7 F (36.5 C) (Temporal)   Ht 5' 2 (1.575 m)   Wt 139 lb (63 kg)   LMP 04/30/2023 (Exact Date)   SpO2 99%   BMI 25.42 kg/m  Physical Exam Vitals and nursing note reviewed.  Constitutional:      Appearance: Normal appearance.  HENT:     Head: Normocephalic.     Right Ear: Tympanic membrane normal.     Left Ear: Tympanic membrane normal.     Nose: Nose normal.     Mouth/Throat:     Mouth: Mucous membranes are moist.  Eyes:     Pupils: Pupils are equal, round, and reactive to light.  Cardiovascular:     Rate and Rhythm: Normal rate and regular rhythm.  Pulmonary:     Effort: Pulmonary effort is normal.      Breath sounds: Normal breath sounds.  Musculoskeletal:        General: Normal range of motion.     Cervical back: Normal range of motion.     Comments: firm band of tissue noted on left palmar, proximal to 4th digit.    Lymphadenopathy:     Cervical: No cervical adenopathy.  Skin:    General: Skin is warm and dry.  Neurological:     Mental Status: She is alert.  Psychiatric:        Mood and Affect: Mood normal.        Behavior: Behavior normal.      Assessment and Plan   Health Maintenance counseling: 1. Anticipatory guidance: Patient counseled regarding regular dental exams q6 months, eye exams,  avoiding smoking and second hand smoke, limiting alcohol to 1 beverage per day, no illicit drugs.   2. Risk factor reduction:  Advised patient of need for regular exercise and diet rich with fruits and vegetables to reduce risk of heart attack and stroke. Exercise- rarely, pt has been too busy with work.  Wt Readings from Last 3 Encounters:  05/11/23 139 lb (63 kg)  05/04/22 135 lb (61.2 kg)  04/17/22 142 lb 2 oz (64.5 kg)   3. Immunizations/screenings/ancillary studies Immunization History  Administered Date(s) Administered   Influenza,inj,Quad PF,6+ Mos 04/03/2017, 02/18/2021, 03/22/2022   Moderna Sars-Covid-2 Vaccination 07/02/2019, 07/31/2019, 03/07/2020   Tdap 03/16/2016   Zoster Recombinant(Shingrix ) 02/18/2021, 06/15/2021   Health Maintenance Due  Topic Date Due   Pneumococcal Vaccine 23-23 Years old (1 of 2 - PCV) Never done   Cervical Cancer Screening (HPV/Pap Cotest)  04/03/2022   INFLUENZA VACCINE  11/30/2022    4. Cervical cancer screening- due, sending referral to GYN 5. Breast cancer screening-  mammogram: last done 2023 6. Colon cancer screening - due, ordering again today 7. Skin cancer screening- advised regular sunscreen use. Denies worrisome, changing, or new skin lesions.  8. Birth control/STD check- N/A - pt still menstruating 9. Osteoporosis  screening- N/A 10. Alcohol screening: rare 11. Smoking associated screening (lung cancer screening, AAA screen 65-75, UA)- non- smoker  Annual physical exam -     Comprehensive metabolic panel -     CBC with Differential/Platelet  Immunization due -     Flu vaccine trivalent PF, 6mos and older(Flulaval,Afluria,Fluarix,Fluzone)  Need for hepatitis C screening test -     Hepatitis C antibody  Mixed hyperlipidemia -     Lipid panel  Encounter for gynecological examination -     Ambulatory referral to Obstetrics / Gynecology  Perimenopausal symptom Assessment & Plan:  Chronic, stable pt reports her cycle stopped in August but restarted last week of Dec. pt has been taking Estroven with some relief, reminded pt ok to add Black Cohosh to the [place] sending to GYN for PAP, advised to ask about Vehozah or HRT  continue to advise on limiting caffeine, dressing in cool layers, etc f/u prn   Recommended follow up: Return for any future concerns, Complete physical w/fasting labs. No future appointments.  Lab/Order associations: fasting   Lucius Krabbe, NP

## 2023-05-11 NOTE — Patient Instructions (Addendum)
 It was very nice to see you today!   I will review your lab results via MyChart in a few days. I have sent over the referral to gynecology.  Check with your insurance if they cover weight loss visits and medications: Wegovy or Zepbound injectables, or pills - Qsymia, Phentermine or Contrave.  Happy new year!      PLEASE NOTE:  If you had any lab tests please let us  know if you have not heard back within a few days. You may see your results on MyChart before we have a chance to review them but we will give you a call once they are reviewed by us . If we ordered any referrals today, please let us  know if you have not heard from their office within the next week.

## 2023-05-12 LAB — HEPATITIS C ANTIBODY: Hepatitis C Ab: NONREACTIVE

## 2023-09-01 IMAGING — MG MM DIGITAL SCREENING BILAT W/ TOMO AND CAD
8 series · 8 of 24 positions shown · non-contrast
Comparison: Previous exam(s).

CLINICAL DATA: Screening.

EXAM:
DIGITAL SCREENING BILATERAL MAMMOGRAM WITH TOMOSYNTHESIS AND CAD
TECHNIQUE: Bilateral screening digital craniocaudal and mediolateral oblique
mammograms were obtained. Bilateral screening digital breast
tomosynthesis was performed. The images were evaluated with
computer-aided detection.

[R CC synth-2D]
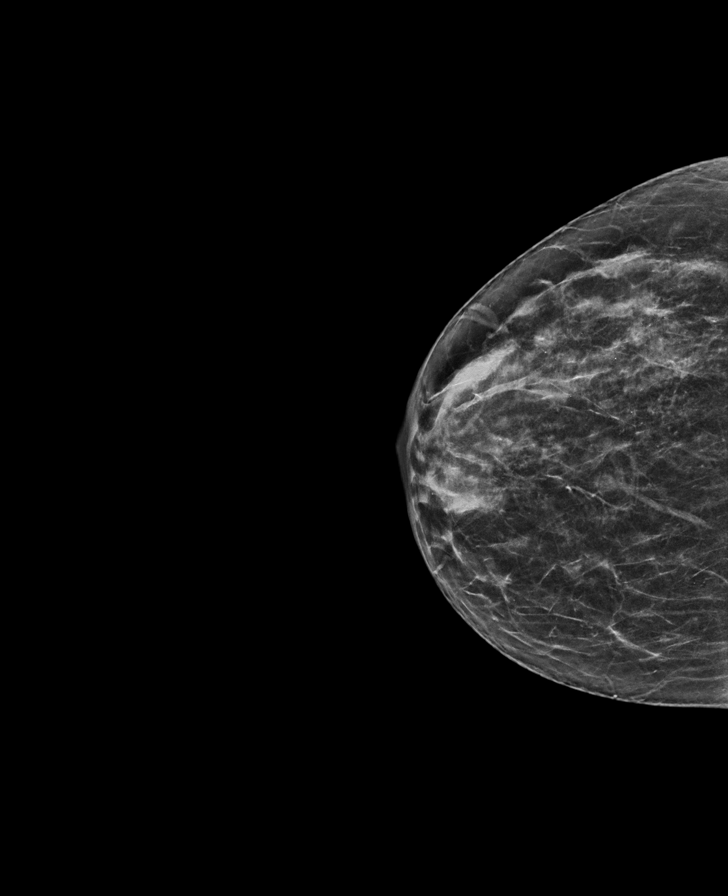

[L CC synth-2D]
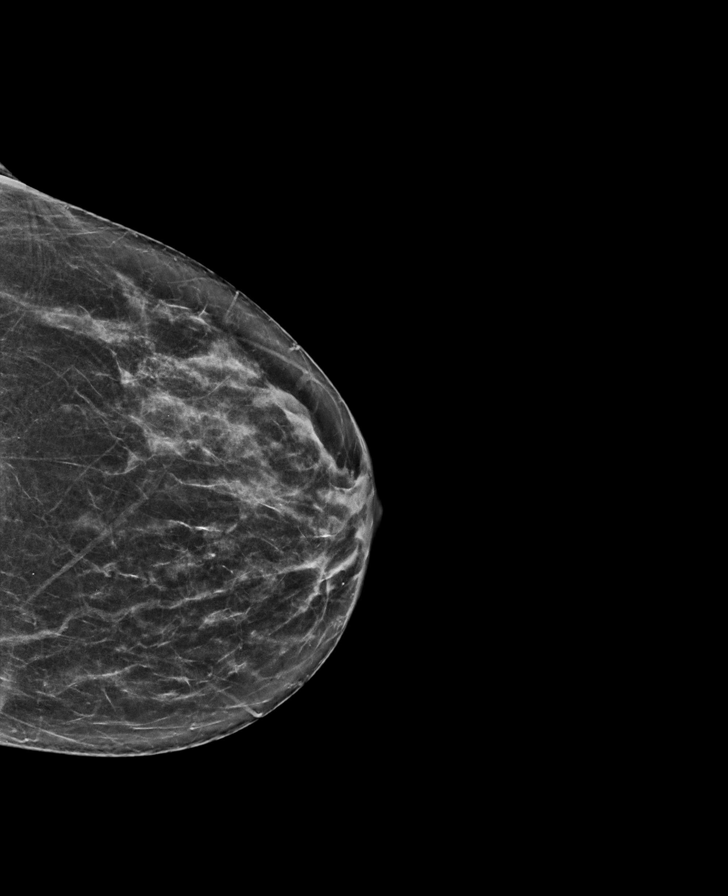

[R MLO synth-2D]
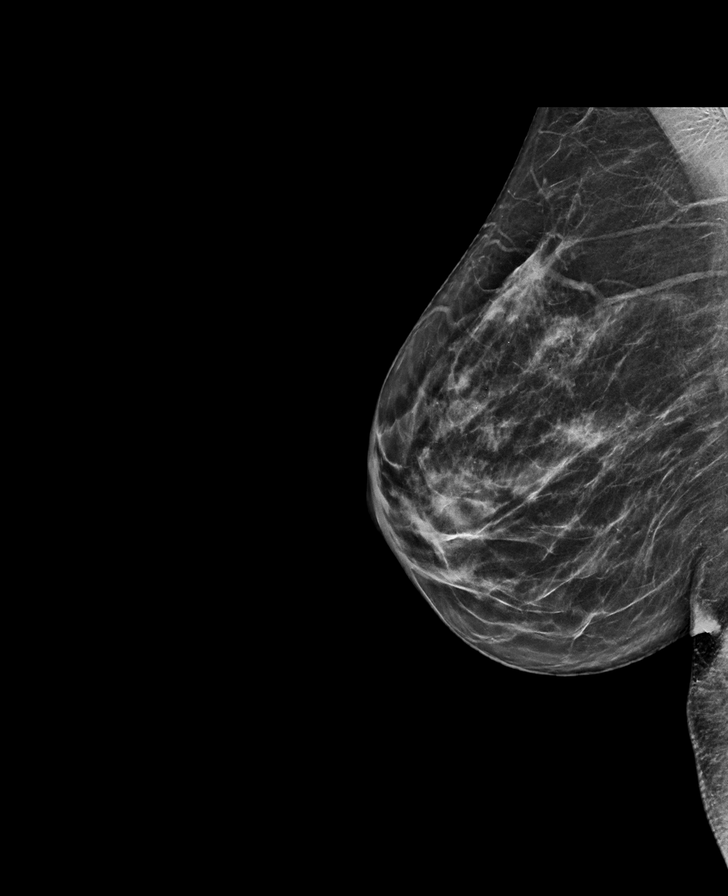

[L MLO synth-2D]
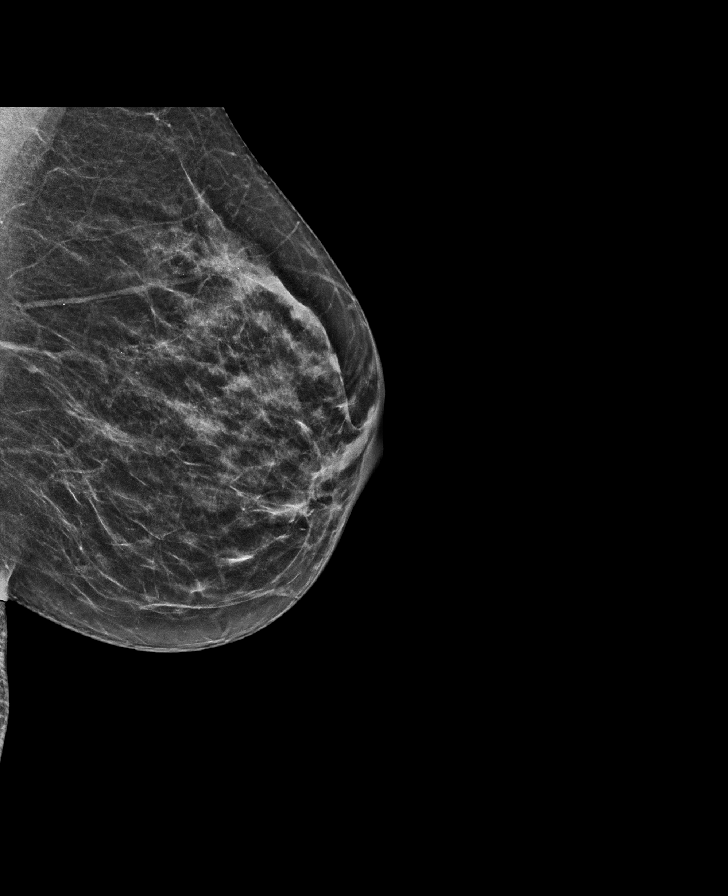

[R MLO tomo · tomo slice 33/65.0]
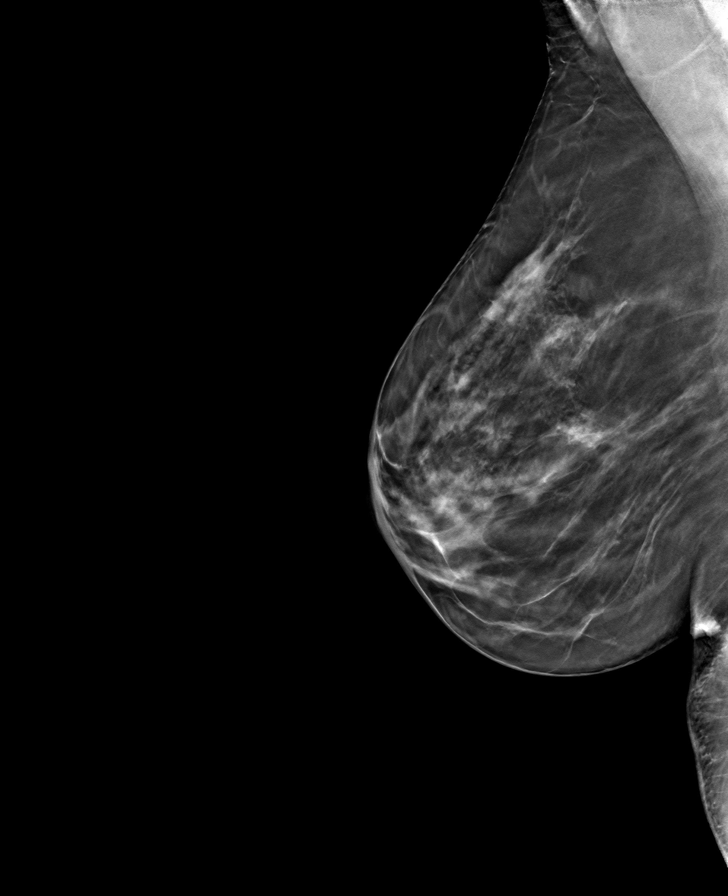

[L CC tomo · tomo slice 31/61.0]
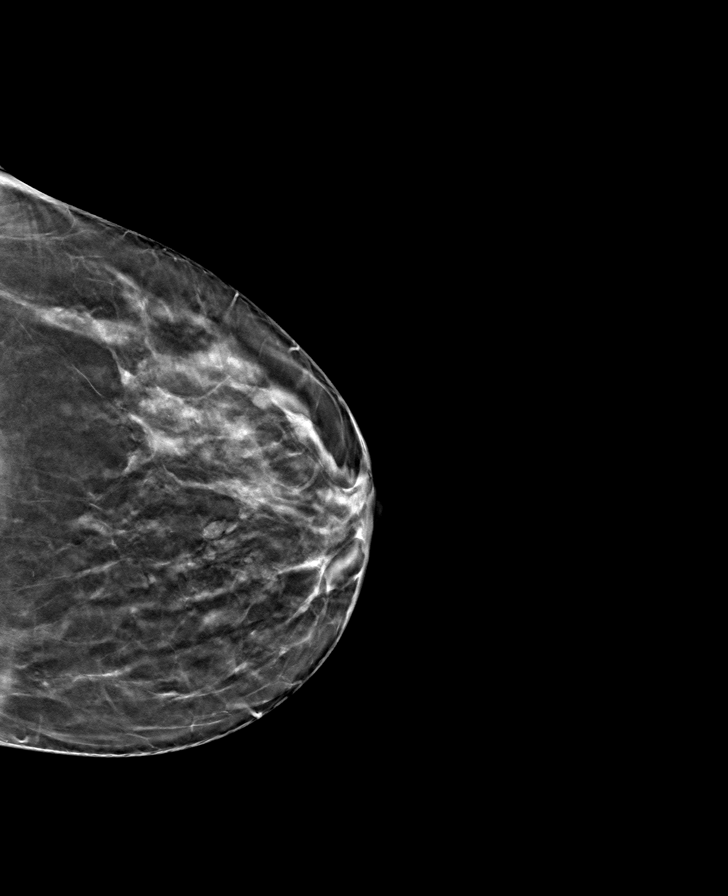

[R CC tomo · tomo slice 29/58.0]
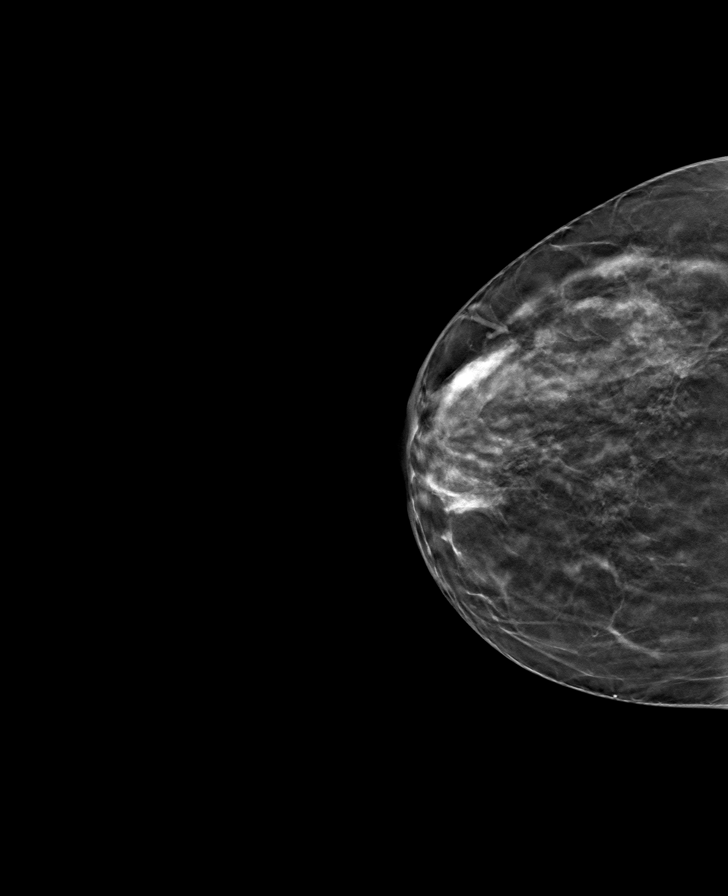

[L MLO tomo · tomo slice 28/55.0]
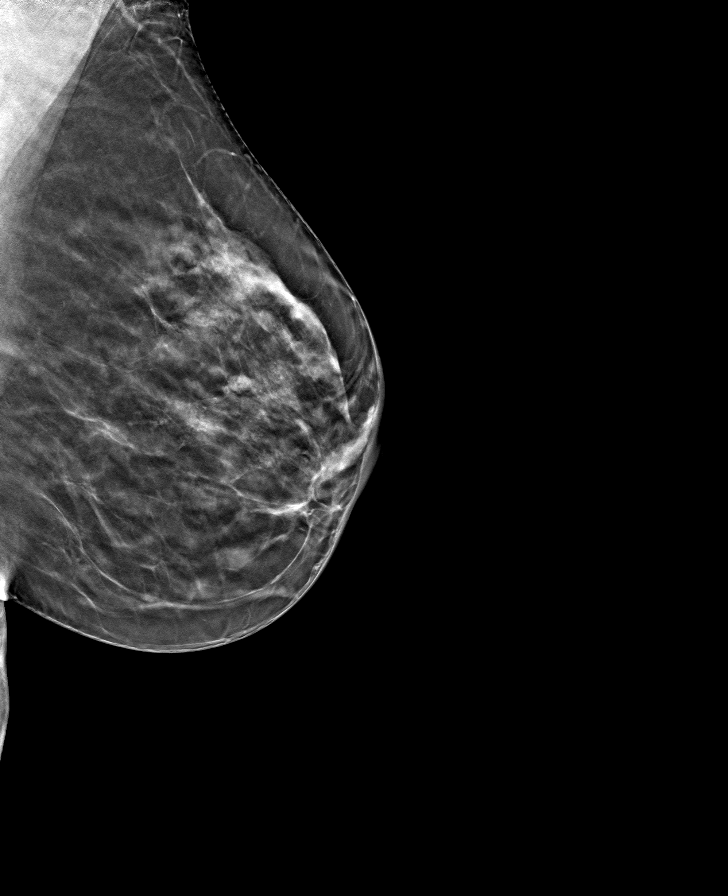

[8 of 24 positions shown; findings below may reference images not displayed]

ACR Breast Density Category c: The breast tissue is heterogeneously
dense, which may obscure small masses.
FINDINGS: In the right breast, possible distortion warrants further
evaluation. This possible distortion is seen within the upper RIGHT
breast, MLO slice 43, possible correlate on the cc view within the
outer RIGHT breast.

In the left breast, no findings suspicious for malignancy.
IMPRESSION: Further evaluation is suggested for possible distortion in the right
breast.

RECOMMENDATION:
Diagnostic mammogram and possibly ultrasound of the right breast.
(Code:[id number])

The patient will be contacted regarding the findings, and additional
imaging will be scheduled.

BI-RADS CATEGORY  0: Incomplete. Need additional imaging evaluation
and/or prior mammograms for comparison.

## 2023-09-29 IMAGING — US US BREAST*R* LIMITED INC AXILLA
1 series · 4 of 4 positions shown · non-contrast
Comparison: Previous exam(s).

CLINICAL DATA: The patient was called back for possible right
breast distortion.

EXAM:
DIGITAL DIAGNOSTIC UNILATERAL RIGHT MAMMOGRAM WITH TOMOSYNTHESIS AND
CAD; ULTRASOUND RIGHT BREAST LIMITED
TECHNIQUE: Right digital diagnostic mammography and breast tomosynthesis was
performed. The images were evaluated with computer-aided detection.;
Targeted ultrasound examination of the right breast was performed

[Series 1: us breast*right* limited inc axilla · 0.07mm/px · 4 of 4 slices shown]
[im 1/4]
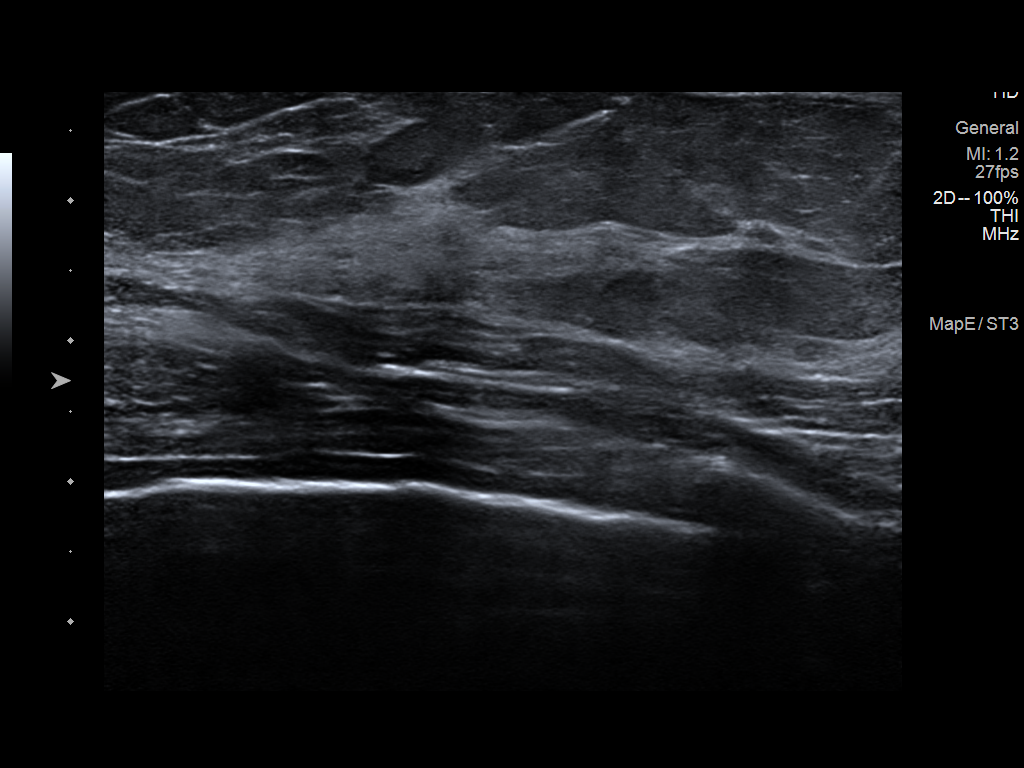
[im 2/4]
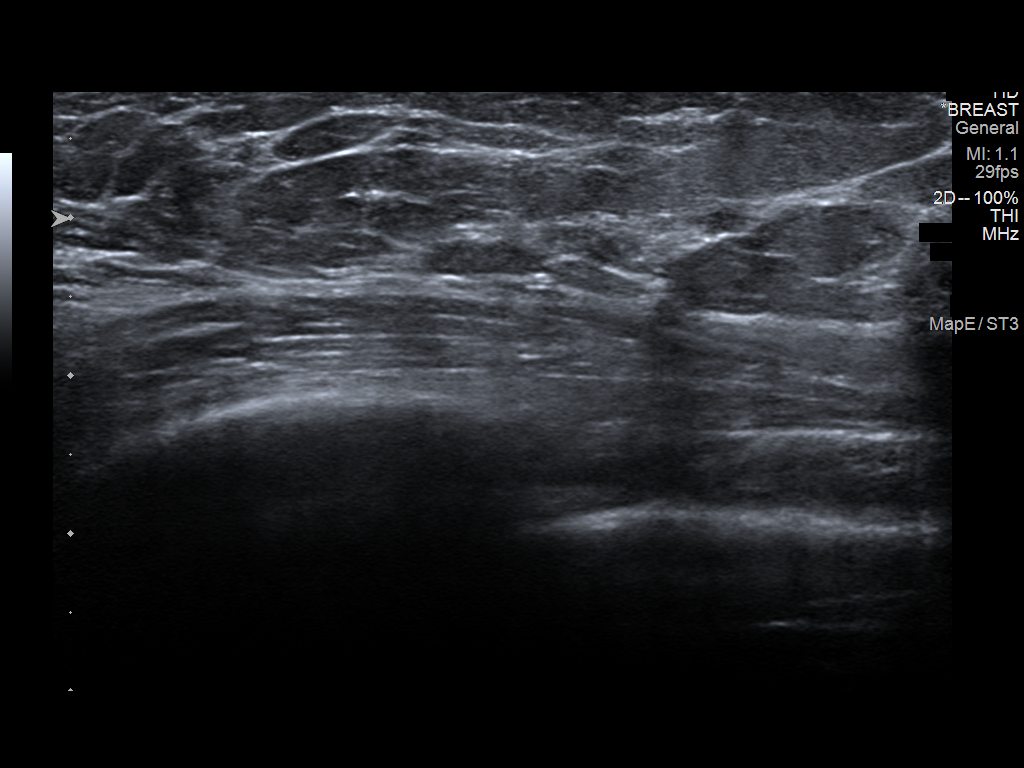
[im 3/4]
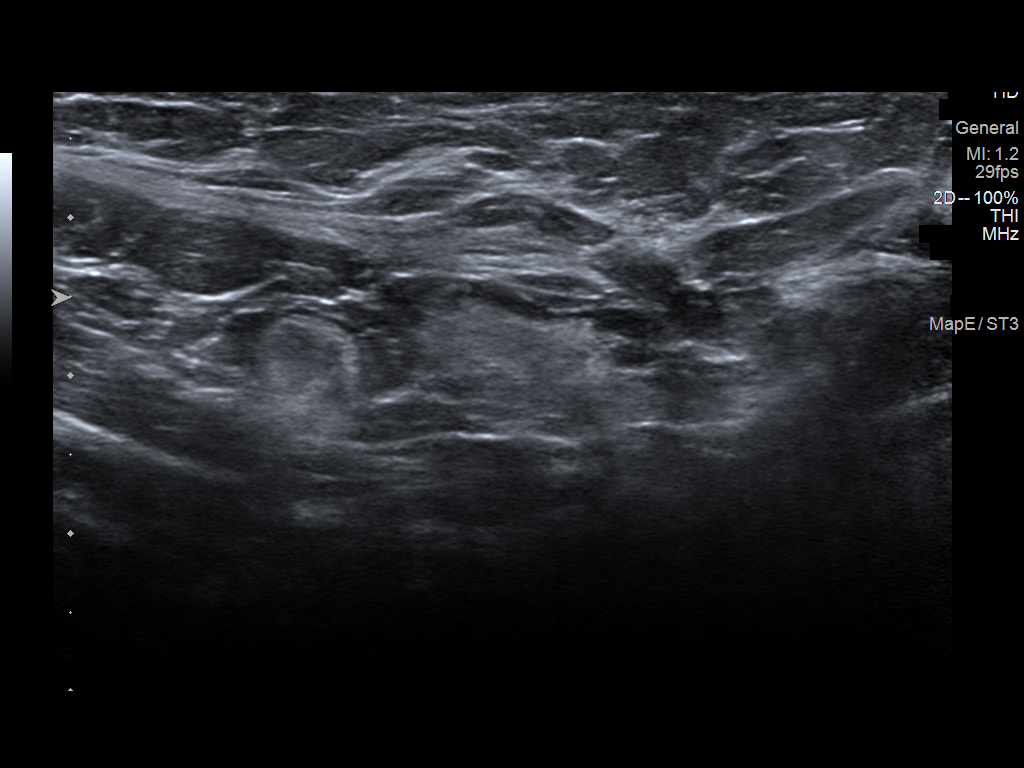
[im 4/4]
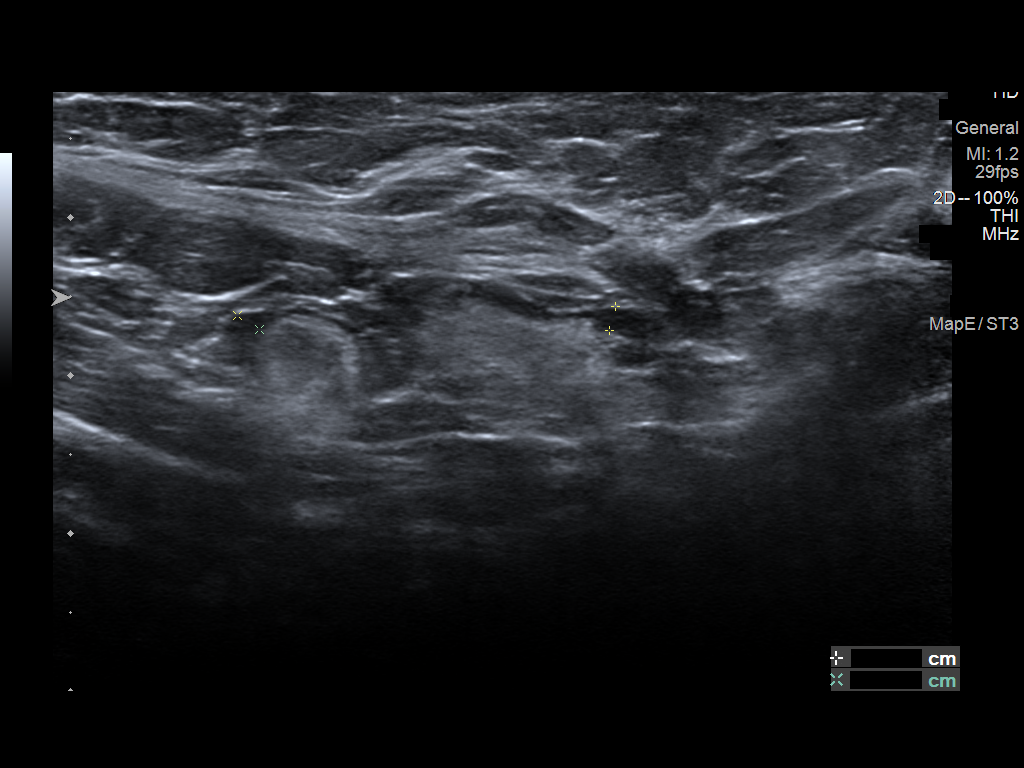

[4 of 4 positions shown; findings below may reference images not displayed]

ACR Breast Density Category c: The breast tissue is heterogeneously
dense, which may obscure small masses.
FINDINGS: The patient was called back for possible distortion in the lateral
slightly superior right breast. Possible distortion remains in this
region on today's imaging.

On physical exam, no suspicious lumps are identified.

Targeted ultrasound is performed, showing no sonographic correlate
to the possible distortion.
IMPRESSION: Possible distortion in the lateral slightly superior right breast.

RECOMMENDATION:
Recommend stereotactic biopsy of the possible right breast
distortion. The patient declined biopsy and prefers six-month
follow-up.

I have discussed the findings and recommendations with the patient.
If applicable, a reminder letter will be sent to the patient
regarding the next appointment.

BI-RADS CATEGORY  4: Suspicious.

## 2023-11-05 DIAGNOSIS — S60211A Contusion of right wrist, initial encounter: Secondary | ICD-10-CM | POA: Diagnosis not present

## 2023-11-19 ENCOUNTER — Telehealth: Payer: Self-pay

## 2023-11-19 DIAGNOSIS — N951 Menopausal and female climacteric states: Secondary | ICD-10-CM

## 2023-11-19 NOTE — Telephone Encounter (Signed)
 Copied from CRM [id number]. Topic: Referral - Request for Referral >> Nov 19, 2023 12:24 PM Aleatha C wrote: Did the patient discuss referral with their provider in the last year? Yes (If No - schedule appointment) (If Yes - send message)  Appointment offered? No  Type of order/referral and detailed reason for visit: Gynologist request for Patient reaching Menopause  Preference of office, provider, location: [facility]  If referral order, have you been seen by this specialty before? No (If Yes, this issue or another issue? When? Where?  Can we respond through MyChart? No  Referral Placed.

## 2023-11-26 ENCOUNTER — Ambulatory Visit: Admitting: Orthopaedic Surgery

## 2023-11-26 ENCOUNTER — Encounter: Payer: Self-pay | Admitting: Orthopaedic Surgery

## 2023-11-26 ENCOUNTER — Other Ambulatory Visit (INDEPENDENT_AMBULATORY_CARE_PROVIDER_SITE_OTHER): Payer: Self-pay

## 2023-11-26 ENCOUNTER — Other Ambulatory Visit: Payer: Self-pay

## 2023-11-26 DIAGNOSIS — M25531 Pain in right wrist: Secondary | ICD-10-CM
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
## 2023-11-30 ENCOUNTER — Ambulatory Visit: Admitting: Orthopaedic Surgery
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
## 2023-12-11 ENCOUNTER — Ambulatory Visit: Admitting: Rehabilitative and Restorative Service Providers"

## 2023-12-11 ENCOUNTER — Encounter: Payer: Self-pay | Admitting: Rehabilitative and Restorative Service Providers"

## 2023-12-11 DIAGNOSIS — M25531 Pain in right wrist: Secondary | ICD-10-CM | POA: Diagnosis not present

## 2023-12-11 DIAGNOSIS — M6281 Muscle weakness (generalized): Secondary | ICD-10-CM | POA: Diagnosis not present

## 2023-12-26 ENCOUNTER — Encounter: Payer: Self-pay | Admitting: Orthopaedic Surgery

## 2023-12-26 ENCOUNTER — Ambulatory Visit (INDEPENDENT_AMBULATORY_CARE_PROVIDER_SITE_OTHER): Admitting: Orthopaedic Surgery

## 2023-12-26 DIAGNOSIS — M25531 Pain in right wrist: Secondary | ICD-10-CM

## 2023-12-26 NOTE — Progress Notes (Signed)
 The patient comes for follow-up for her right wrist.  She has been to physical therapy and saw Nate upstairs.  She is very pleased with the progress she has made with her wrist and said he has shown her some things to do on her own.  She is a Development worker, international aid but still states she may wait a few weeks before getting back to playing tennis but she says her wrist overall is doing much better and is essentially pain-free.  Her right wrist today has full range of motion and no instability on exam.  There is no pain of ovation around the ulnar side of the wrist where she was hurting before.  This point follow-up can be as needed.  She can get back to playing tennis slowly as comfort allows.

## 2024-02-04 ENCOUNTER — Encounter: Payer: Self-pay | Admitting: Obstetrics and Gynecology

## 2024-02-04 ENCOUNTER — Ambulatory Visit: Admitting: Obstetrics and Gynecology

## 2024-02-04 VITALS — BP 112/64 | HR 60 | Temp 98.5°F | Ht 63.0 in | Wt 141.0 lb

## 2024-02-04 DIAGNOSIS — N941 Unspecified dyspareunia: Secondary | ICD-10-CM | POA: Diagnosis not present

## 2024-02-04 DIAGNOSIS — N951 Menopausal and female climacteric states: Secondary | ICD-10-CM | POA: Diagnosis not present

## 2024-02-04 DIAGNOSIS — R928 Other abnormal and inconclusive findings on diagnostic imaging of breast: Secondary | ICD-10-CM

## 2024-02-04 MED ORDER — GABAPENTIN 100 MG PO CAPS
ORAL_CAPSULE | ORAL | 0 refills | Status: AC
Start: 2024-02-04 — End: ?

## 2024-02-04 MED ORDER — ESTRADIOL 0.1 MG/GM VA CREA: TOPICAL_CREAM | VAGINAL | 1 refills | Status: AC

## 2024-02-04 NOTE — Assessment & Plan Note (Signed)
 2/2 perimenopausal changes Reviewed safety profile of low dose vaginal estrogen, however reviewed that higher doses have been associated with DVT, breast and uterine cancer.

## 2024-02-04 NOTE — Assessment & Plan Note (Signed)
 Patient with abnormal MMG in 2024, with biopsy recommended She has not completed this in part due to stability of findings on MMG over 1 year and also because she prefers anesthesia for her biopsy. Recommend updated MMG Discussed nonhormonal options for VMS, including SSRIs, veozah, gabapetin.  Patient elects for gabapentin.  Gabapentin has side effects such as dizziness, drowsiness, mood irritability/lability, GI upset, and rash. Suicidal ideation can happpen in a subset of patient, and we reviewed that the medication would need to be stopped for this reason.

## 2024-02-04 NOTE — Progress Notes (Signed)
 53 y.o. G3P1001 female here for perimenopause consult. Married.  Patient's last menstrual period was 10/23/2023 (exact date).   She reports hot flashes, night sweats, mood changes, joint pain, brain fog, urinary frequency, painful IC. She has tried black cohosh and estroven with minimal relief. Urine sample provided: No  Birth control: None Last mammogram: 05/05/22 density c, birads 4 suspicious; declined biopsy, prefers to be asleep for biopsy Sexually active: yes, less frequent due to symptoms   GYN HISTORY: No sig hx  OB History  Gravida Para Term Preterm AB Living  1 1 1   1   SAB IAB Ectopic Multiple Live Births      1    # Outcome Date GA Lbr Len/2nd Weight Sex Type Anes PTL Lv  1 Term      CS-Unspec   LIV   Past Medical History:  Diagnosis Date   Allergy    Annual physical exam 02/18/2021   Asthma    External thrombosed hemorrhoids    History of chicken pox    Need for immunization against influenza 02/18/2021   Past Surgical History:  Procedure Laterality Date   CESAREAN SECTION  1999   CYST EXCISION     TONSILLECTOMY     Current Outpatient Medications on File Prior to Visit  Medication Sig Dispense Refill   albuterol (PROVENTIL HFA;VENTOLIN HFA) 108 (90 BASE) MCG/ACT inhaler Inhale into the lungs every 6 (six) hours as needed for wheezing or shortness of breath.     Ascorbic Acid (VITAMIN C) 1000 MG tablet Take 1,000 mg by mouth daily.     Ascorbic Acid (VITAMIN C) 1000 MG tablet Take 1,000 mg by mouth daily.     azelastine (ASTELIN) 0.1 % nasal spray Place into the nose.     COLLAGEN PO Take 20 mg by mouth daily.     Docusate Sodium (DSS) 100 MG CAPS Take by mouth.     fexofenadine (ALLEGRA) 180 MG tablet Take 180 mg by mouth as needed.      fluticasone  (FLONASE ) 50 MCG/ACT nasal spray Place 2 sprays into both nostrils daily. 16 g 0   hydrocortisone  (PROCTOCORT ) 1 % CREA Apply to affected hemorrhoid twice daily as needed 1 Tube 1   No current  facility-administered medications on file prior to visit.   Allergies  Allergen Reactions   Dust Mite Extract Shortness Of Breath   Other Shortness Of Breath    roach      PE Today's Vitals   02/04/24 1350  BP: 112/64  Pulse: 60  Temp: 98.5 F (36.9 C)  TempSrc: Oral  SpO2: 99%  Weight: 141 lb (64 kg)  Height: 5' 3 (1.6 m)   Body mass index is 24.98 kg/m.  Physical Exam Vitals reviewed.  Constitutional:      General: She is not in acute distress.    Appearance: Normal appearance.  HENT:     Head: Normocephalic and atraumatic.     Nose: Nose normal.  Eyes:     Extraocular Movements: Extraocular movements intact.     Conjunctiva/sclera: Conjunctivae normal.  Pulmonary:     Effort: Pulmonary effort is normal.  Musculoskeletal:        General: Normal range of motion.     Cervical back: Normal range of motion.  Neurological:     General: No focal deficit present.     Mental Status: She is alert.  Psychiatric:        Mood and Affect: Mood normal.  Behavior: Behavior normal.      Assessment and Plan:        Perimenopause Assessment & Plan: Patient with abnormal MMG in 2024, with biopsy recommended She has not completed this in part due to stability of findings on MMG over 1 year and also because she prefers anesthesia for her biopsy. Recommend updated MMG Discussed nonhormonal options for VMS, including SSRIs, veozah, gabapetin.  Patient elects for gabapentin.  Gabapentin has side effects such as dizziness, drowsiness, mood irritability/lability, GI upset, and rash. Suicidal ideation can happpen in a subset of patient, and we reviewed that the medication would need to be stopped for this reason.    Orders: -     Gabapentin; Take 1 capsule (100 mg total) by mouth at bedtime for 3 days, THEN 2 capsules (200 mg total) at bedtime for 3 days, THEN 3 capsules (300 mg total) at bedtime.  Dispense: 270 capsule; Refill: 0 -     Estradiol; Apply 1/2 gram to  vulva nightly for 2 weeks then decrease to 1/2 gram to vulva two nights a week. Do not use applicator.  Dispense: 42.5 g; Refill: 1  Dyspareunia in female Assessment & Plan: 2/2 perimenopausal changes Reviewed safety profile of low dose vaginal estrogen, however reviewed that higher doses have been associated with DVT, breast and uterine cancer.   Orders: -     Estradiol; Apply 1/2 gram to vulva nightly for 2 weeks then decrease to 1/2 gram to vulva two nights a week. Do not use applicator.  Dispense: 42.5 g; Refill: 1  Abnormal mammogram -     MM 3D DIAGNOSTIC MAMMOGRAM BILATERAL BREAST; Future  RTO for annual exam in 6 wk   Charley Miske LULLA Pa, MD

## 2024-02-12 ENCOUNTER — Other Ambulatory Visit: Payer: Self-pay | Admitting: Obstetrics and Gynecology

## 2024-02-12 DIAGNOSIS — N951 Menopausal and female climacteric states: Secondary | ICD-10-CM

## 2024-02-12 DIAGNOSIS — N941 Unspecified dyspareunia: Secondary | ICD-10-CM

## 2024-02-12 DIAGNOSIS — N6489 Other specified disorders of breast: Secondary | ICD-10-CM

## 2024-03-03 ENCOUNTER — Encounter: Payer: Self-pay | Admitting: Radiology

## 2024-04-08 ENCOUNTER — Ambulatory Visit: Admitting: Obstetrics and Gynecology

## 2024-05-10 ENCOUNTER — Other Ambulatory Visit: Payer: Self-pay | Admitting: Obstetrics and Gynecology

## 2024-05-10 DIAGNOSIS — N951 Menopausal and female climacteric states: Secondary | ICD-10-CM

## 2024-05-12 NOTE — Telephone Encounter (Signed)
 Med refill request:   gabapentin  (NEURONTIN ) 100 MG capsule  Start:  02/04/24 Disp:  270 capsules Refills:  0  Last AEX:  02/04/24 Next AEX:  Not yet scheduled Last MMG (if hormonal med):  05/05/22 Refill authorized? Please Advise.

## 2024-05-12 NOTE — Telephone Encounter (Signed)
 Needs appt

## 2024-05-13 ENCOUNTER — Ambulatory Visit: Payer: BC Managed Care – PPO | Admitting: Family

## 2024-05-13 ENCOUNTER — Encounter: Payer: Self-pay | Admitting: Family

## 2024-05-13 VITALS — BP 120/83 | HR 64 | Temp 97.0°F | Ht 63.0 in | Wt 143.9 lb

## 2024-05-13 DIAGNOSIS — Z0000 Encounter for general adult medical examination without abnormal findings: Secondary | ICD-10-CM | POA: Diagnosis not present

## 2024-05-13 DIAGNOSIS — M778 Other enthesopathies, not elsewhere classified: Secondary | ICD-10-CM | POA: Diagnosis not present

## 2024-05-13 DIAGNOSIS — Z23 Encounter for immunization: Secondary | ICD-10-CM

## 2024-05-13 DIAGNOSIS — E782 Mixed hyperlipidemia: Secondary | ICD-10-CM | POA: Diagnosis not present

## 2024-05-13 LAB — CBC WITH DIFFERENTIAL/PLATELET
Basophils Absolute: 0.1 K/uL (ref 0.0–0.1)
Basophils Relative: 1.6 % (ref 0.0–3.0)
Eosinophils Absolute: 0.2 K/uL (ref 0.0–0.7)
Eosinophils Relative: 4.4 % (ref 0.0–5.0)
HCT: 40 % (ref 36.0–46.0)
Hemoglobin: 13.6 g/dL (ref 12.0–15.0)
Lymphocytes Relative: 30.7 % (ref 12.0–46.0)
Lymphs Abs: 1.3 K/uL (ref 0.7–4.0)
MCHC: 34.1 g/dL (ref 30.0–36.0)
MCV: 86.8 fl (ref 78.0–100.0)
Monocytes Absolute: 0.3 K/uL (ref 0.1–1.0)
Monocytes Relative: 7.6 % (ref 3.0–12.0)
Neutro Abs: 2.5 K/uL (ref 1.4–7.7)
Neutrophils Relative %: 55.7 % (ref 43.0–77.0)
Platelets: 236 K/uL (ref 150.0–400.0)
RBC: 4.6 Mil/uL (ref 3.87–5.11)
RDW: 13.7 % (ref 11.5–15.5)
WBC: 4.4 K/uL (ref 4.0–10.5)

## 2024-05-13 LAB — COMPREHENSIVE METABOLIC PANEL WITH GFR
ALT: 13 U/L (ref 3–35)
AST: 23 U/L (ref 5–37)
Albumin: 4.2 g/dL (ref 3.5–5.2)
Alkaline Phosphatase: 45 U/L (ref 39–117)
BUN: 13 mg/dL (ref 6–23)
CO2: 26 meq/L (ref 19–32)
Calcium: 8.8 mg/dL (ref 8.4–10.5)
Chloride: 107 meq/L (ref 96–112)
Creatinine, Ser: 0.71 mg/dL (ref 0.40–1.20)
GFR: 96.47 mL/min
Glucose, Bld: 94 mg/dL (ref 70–99)
Potassium: 3.9 meq/L (ref 3.5–5.1)
Sodium: 138 meq/L (ref 135–145)
Total Bilirubin: 0.4 mg/dL (ref 0.2–1.2)
Total Protein: 6.7 g/dL (ref 6.0–8.3)

## 2024-05-13 LAB — LIPID PANEL
Cholesterol: 222 mg/dL — ABNORMAL HIGH (ref 28–200)
HDL: 56.2 mg/dL
LDL Cholesterol: 148 mg/dL — ABNORMAL HIGH (ref 10–99)
NonHDL: 165.99
Total CHOL/HDL Ratio: 4
Triglycerides: 89 mg/dL (ref 10.0–149.0)
VLDL: 17.8 mg/dL (ref 0.0–40.0)

## 2024-05-13 NOTE — Progress Notes (Signed)
 " Phone [phone]  Subjective:   Patient is a 54 y.o. female presenting for annual physical.    Chief Complaint  Patient presents with   Annual Exam    Fasting w/ labs  Discussed the use of AI scribe software for clinical note transcription with the patient, who gave verbal consent to proceed.  History of Present Illness Rachel Castillo is a 54 year old female who presents for an annual physical exam.  She is still menstruating with cycles with her last one around [place] and prior to that had been 6 mos back June. The current cycle is longer than usual with lingering spotting. She has left thumb pain described as a pulling sensation without numbness or tingling. A brace and heat help relieve the pain. She takes gabapentin  100 mg daily and finds it effective. She is worried about weight gain, but her weight has changed only 2 to 3 pounds over the last two years. She exercises regularly at the gym 4 times per week with strength training and Zumba. She uses minimal alcohol and does not smoke, vape, or use THC. She has a fear of needles but is able to manage it for care. 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  Hyperlipidemia: Patient is currently maintained on the following medication for hyperlipidemia: none. Patient reports fair compliance with low fat/low cholesterol diet.  Last lipid panel as follows: Lab Results  Component Value Date   CHOL 223 (H) 05/11/2023   HDL 47.20 05/11/2023   LDLCALC 159 (H) 05/11/2023   TRIG 86.0 05/11/2023   CHOLHDL 5 05/11/2023   See problem oriented charting- ROS- full  review of systems was completed and negative except for what is noted in HPI above.  The following were reviewed and entered/updated in epic: Past Medical History:  Diagnosis Date   Allergy    Annual physical exam 02/18/2021   Asthma    External thrombosed hemorrhoids    History of  chicken pox    Need for immunization against influenza 02/18/2021   Patient Active Problem List   Diagnosis Date Noted   Perimenopause 02/04/2024   Dyspareunia in female 02/04/2024   Mixed hyperlipidemia 04/17/2022   Pain in multiple finger joints 02/18/2021   Dupuytren's contracture of both hands 02/18/2021   Perimenopausal symptom 02/18/2021   Seasonal allergic rhinitis 04/03/2017   Allergic asthma, mild intermittent, uncomplicated 04/03/2017   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   Past Surgical History:  Procedure Laterality Date   CESAREAN SECTION  1999   CYST EXCISION     TONSILLECTOMY     Family History  Problem Relation Age of Onset   Hearing loss Mother    Hypertension Mother    Hyperlipidemia Father    Diabetes Father    Alcohol abuse Father    Heart attack Maternal Grandfather    Heart attack Paternal Grandfather    Breast cancer Cousin    Healthy Brother    Healthy Son    Cancer Neg Hx     Medications- reviewed and updated Current Outpatient Medications  Medication Sig Dispense Refill   albuterol (PROVENTIL HFA;VENTOLIN HFA) 108 (90 BASE) MCG/ACT inhaler Inhale into the lungs every 6 (six) hours as needed for wheezing or shortness of breath.     Ascorbic Acid (VITAMIN C) 1000 MG tablet Take 1,000 mg by mouth daily.     Ascorbic Acid (VITAMIN C) 1000 MG tablet Take 1,000  mg by mouth daily.     azelastine (ASTELIN) 0.1 % nasal spray Place into the nose.     COLLAGEN PO Take 20 mg by mouth daily.     Docusate Sodium (DSS) 100 MG CAPS Take by mouth.     estradiol  (ESTRACE  VAGINAL) 0.1 MG/GM vaginal cream Apply 1/2 gram to vulva nightly for 2 weeks then decrease to 1/2 gram to vulva two nights a week. Do not use applicator. 42.5 g 1   fexofenadine (ALLEGRA) 180 MG tablet Take 180 mg by mouth as needed.      fluticasone  (FLONASE ) 50 MCG/ACT nasal spray Place 2 sprays into both nostrils daily. 16 g 0   gabapentin  (NEURONTIN ) 100 MG capsule Take 1 capsule (100 mg total) by  mouth at bedtime for 3 days, THEN 2 capsules (200 mg total) at bedtime for 3 days, THEN 3 capsules (300 mg total) at bedtime. 270 capsule 0   hydrocortisone  (PROCTOCORT ) 1 % CREA Apply to affected hemorrhoid twice daily as needed 1 Tube 1   No current facility-administered medications for this visit.    Allergies-reviewed and updated Allergies  Allergen Reactions   Dust Mite Extract Shortness Of Breath   Other Shortness Of Breath    roach    Social History   Social History Narrative   Not on file   Objective:  BP 120/83 (BP Location: Left Arm, Patient Position: Sitting, Cuff Size: Normal)   Pulse 64   Temp (!) 97 F (36.1 C) (Temporal)   Ht 5' 3 (1.6 m)   Wt 143 lb 14.4 oz (65.3 kg)   LMP 05/01/2024 (Exact Date)   SpO2 97%   BMI 25.49 kg/m  Physical Exam Vitals and nursing note reviewed.  Constitutional:      Appearance: Normal appearance.  HENT:     Head: Normocephalic.     Right Ear: Tympanic membrane normal.     Left Ear: Tympanic membrane normal.     Nose: Nose normal.     Mouth/Throat:     Mouth: Mucous membranes are moist.  Eyes:     Pupils: Pupils are equal, round, and reactive to light.  Cardiovascular:     Rate and Rhythm: Normal rate and regular rhythm.  Pulmonary:     Effort: Pulmonary effort is normal.     Breath sounds: Normal breath sounds.  Musculoskeletal:        General: Normal range of motion.     Left hand: Tenderness present. No swelling or bony tenderness. Normal range of motion. Decreased strength.     Cervical back: Normal range of motion.     Comments:    Lymphadenopathy:     Cervical: No cervical adenopathy.  Skin:    General: Skin is warm and dry.  Neurological:     Mental Status: She is alert.  Psychiatric:        Mood and Affect: Mood normal.        Behavior: Behavior normal.     Assessment and Plan   Health Maintenance counseling: 1. Anticipatory guidance: Patient counseled regarding regular dental exams q6 months,  eye exams,  avoiding smoking and second hand smoke, limiting alcohol to 1 beverage per day, no illicit drugs.   2. Risk factor reduction:  Advised patient of need for regular exercise and diet rich with fruits and vegetables to reduce risk of heart attack and stroke. Exercise- rarely, pt has been too busy with work.  Wt Readings from Last 3 Encounters:  05/13/24 143  lb 14.4 oz (65.3 kg)  02/04/24 141 lb (64 kg)  05/11/23 139 lb (63 kg)   3. Immunizations/screenings/ancillary studies Immunization History  Administered Date(s) Administered   Influenza, Seasonal, Injecte, Preservative Fre 05/11/2023   Influenza,inj,Quad PF,6+ Mos 04/03/2017, 02/18/2021, 03/22/2022   Moderna Sars-Covid-2 Vaccination 07/02/2019, 07/31/2019, 03/07/2020   PNEUMOCOCCAL CONJUGATE-20 05/13/2024   Tdap 03/16/2016   Zoster Recombinant(Shingrix ) 02/18/2021, 06/15/2021   Health Maintenance Due  Topic Date Due   Hepatitis B Vaccines 19-59 Average Risk (1 of 3 - 19+ 3-dose series) Never done   Influenza Vaccine  11/30/2023   Mammogram  05/05/2024    4. Cervical cancer screening- due, scheduled with GYN 5. Breast cancer screening-  mammogram: last done 2024, normal 6. Colon cancer screening - done, 2023 7. Skin cancer screening- advised regular sunscreen use. Denies worrisome, changing, or new skin lesions.  8. Birth control/STD check- N/A - pt still menstruating 9. Osteoporosis screening- N/A 10. Alcohol screening: rare 11. Smoking associated screening (lung cancer screening, AAA screen 65-75, UA)- non- smoker 12. Exercise - working with a trainer, does zumba, 4 times per week Assessment & Plan Woman's Wellness Visit Routine wellness visit with stable weight and normal BMI. Gabapentin  not affecting weight. Discussed dietary changes for weight management and health improvement. - Ordered labs for CBC w/diff and CMP.  - Encouraged continuation of regular exercise. - Advised on dietary modifications: reduce white  carbs, processed foods, and fruit intake; increase fiber intake. - Encouraged hydration with 2.5 to 3 liters of water daily. - Advised on stress management through exercise.  De Quervain's tendinitis Pain in left thumb consistent with De Quervain's tendinitis. Current brace may be inappropriate. Discussed physical therapy and hand specialist referral. - Provided information on De Quervain's tendinitis. - Apply ice up to 20 minutes 2-3 times per day. - Recommended ice application for 20 minutes daily to reduce swelling. - She will contact her hand specialist seen previously for wrist injury, for further evaluation and management, will let me know if new referral needed. - May consider physical therapy for symptom management.  Immunization due -     Pneumococcal conjugate vaccine 20-valent  Recommended follow up: Return for any future concerns, Complete physical w/fasting labs. Future Appointments  Date Time Provider Department Center  05/14/2025  8:30 AM Lucius Krabbe, NP [facility]   Lab/Order associations: fasting  Lucius Krabbe, NP   "

## 2024-05-13 NOTE — Patient Instructions (Addendum)
 It was very nice to see you today!   I will review your lab results via MyChart in a few days. Let me know if you need an orthopedic referral. You look great! Stay well! Keep exercising :-)      PLEASE NOTE:  If you had any lab tests please let us  know if you have not heard back within a few days. You may see your results on MyChart before we have a chance to review them but we will give you a call once they are reviewed by us . If we ordered any referrals today, please let us  know if you have not heard from their office within the next week.

## 2024-05-14 ENCOUNTER — Ambulatory Visit: Payer: Self-pay | Admitting: Family

## 2024-07-10 ENCOUNTER — Encounter

## 2025-05-14 ENCOUNTER — Encounter: Admitting: Family
